# Patient Record
Sex: Female | Born: 1995 | Race: White | Hispanic: No | Marital: Single | Smoking: Former smoker
Health system: Southern US, Community
[De-identification: ages and names within clinical notes are randomized; demographics above are authoritative.]

## PROBLEM LIST (undated history)

## (undated) DIAGNOSIS — E039 Hypothyroidism, unspecified: Secondary | ICD-10-CM

## (undated) DIAGNOSIS — F419 Anxiety disorder, unspecified: Secondary | ICD-10-CM

## (undated) DIAGNOSIS — K219 Gastro-esophageal reflux disease without esophagitis: Secondary | ICD-10-CM

## (undated) DIAGNOSIS — J329 Chronic sinusitis, unspecified: Secondary | ICD-10-CM

## (undated) HISTORY — PX: WISDOM TOOTH EXTRACTION: SHX21

## (undated) HISTORY — PX: NO PAST SURGERIES: SHX2092

---
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
## 2016-05-06 DIAGNOSIS — J329 Chronic sinusitis, unspecified: Secondary | ICD-10-CM

## 2016-05-06 HISTORY — DX: Chronic sinusitis, unspecified: J32.9

## 2016-05-25 DIAGNOSIS — M67432 Ganglion, left wrist: Secondary | ICD-10-CM | POA: Insufficient documentation

## 2016-05-26 ENCOUNTER — Encounter
Admission: RE | Admit: 2016-05-26 | Discharge: 2016-05-26 | Disposition: A | Discharge status: Home / Self Care | Payer: Self-pay | Source: Ambulatory Visit | Attending: Unknown Physician Specialty | Admitting: Unknown Physician Specialty

## 2016-05-26 ENCOUNTER — Encounter: Payer: Self-pay | Admitting: *Deleted

## 2016-05-26 HISTORY — DX: Anxiety disorder, unspecified: F41.9

## 2016-05-26 HISTORY — DX: Hypothyroidism, unspecified: E03.9

## 2016-05-26 HISTORY — DX: Gastro-esophageal reflux disease without esophagitis: K21.9

## 2016-05-26 HISTORY — DX: Chronic sinusitis, unspecified: J32.9

## 2016-05-26 NOTE — Patient Instructions (Signed)
  Your procedure is scheduled on: 06-02-16 Report to Same Day Surgery 2nd floor medical mall [facility](Medical Mall Entrance-take elevator on left to 2nd floor.  Check in with surgery information desk.) To find out your arrival time please call [phone] between 1PM - 3PM on 06-01-16  Remember: Instructions that are not followed completely may result in serious medical risk, up to and including death, or upon the discretion of your surgeon and anesthesiologist your surgery may need to be rescheduled.    _x___ 1. Do not eat food or drink liquids after midnight. No gum chewing or hard candies.     __x__ 2. No Alcohol for 24 hours before or after surgery.   __x__3. No Smoking for 24 prior to surgery.   ____  4. Bring all medications with you on the day of surgery if instructed.    __x__ 5. Notify your doctor if there is any change in your medical condition     (cold, fever, infections).     Do not wear jewelry, make-up, hairpins, clips or nail polish.  Do not wear lotions, powders, or perfumes. You may wear deodorant.  Do not shave 48 hours prior to surgery. Men may shave face and neck.  Do not bring valuables to the hospital.    [facility] not [facility] for any belongings or valuables.               Contacts, dentures or bridgework may not be worn into surgery.  Leave your suitcase in the car. After surgery it may be brought to your room.  For patients admitted to the hospital, discharge time is determined by your treatment team.   Patients discharged the day of surgery will not be allowed to drive home.  You will need someone to drive you home and stay with you the night of your procedure.    Please read over the following fact sheets that you were given:   [facility] Pre[facility] and or MRSA Information   _x___ Take these medicines the morning of surgery with A SIP OF WATER:    1. LEVOTHYROXINE  2.  3.  4.  5.  6.  ____Fleets enema or Magnesium Citrate as  directed.   ____ Use CHG Soap or sage wipes as directed on instruction sheet   ____ Use inhalers on the day of surgery and bring to hospital day of surgery  ____ Stop metformin 2 days prior to surgery    ____ Take 1/2 of usual insulin dose the night before surgery and none on the morning of surgery.   ____ Stop Aspirin, Coumadin, Pllavix ,Eliquis, Effient, or Pradaxa  x__ Stop Anti-inflammatories such as Advil, Aleve, Ibuprofen, Motrin, Naproxen,          Naprosyn, Goodies powders or aspirin products NOW-Ok to take Tylenol.   ____ Stop supplements until after surgery.    ____ Bring C-Pap to the hospital.

## 2016-06-02 ENCOUNTER — Ambulatory Visit: Payer: BLUE CROSS/BLUE SHIELD | Admitting: Certified Registered"

## 2016-06-02 ENCOUNTER — Encounter
Admission: RE | Disposition: A | Discharge status: Home / Self Care | Payer: Self-pay | Source: Ambulatory Visit | Attending: Unknown Physician Specialty

## 2016-06-02 ENCOUNTER — Encounter: Payer: Self-pay | Admitting: *Deleted

## 2016-06-02 ENCOUNTER — Ambulatory Visit
Admission: RE | Admit: 2016-06-02 | Discharge: 2016-06-02 | Disposition: A | Discharge status: Home / Self Care | Payer: BLUE CROSS/BLUE SHIELD | Source: Ambulatory Visit | Attending: Unknown Physician Specialty | Admitting: Unknown Physician Specialty

## 2016-06-02 DIAGNOSIS — E669 Obesity, unspecified: Secondary | ICD-10-CM | POA: Insufficient documentation

## 2016-06-02 DIAGNOSIS — Z8489 Family history of other specified conditions: Secondary | ICD-10-CM | POA: Insufficient documentation

## 2016-06-02 DIAGNOSIS — E039 Hypothyroidism, unspecified: Secondary | ICD-10-CM | POA: Insufficient documentation

## 2016-06-02 DIAGNOSIS — F329 Major depressive disorder, single episode, unspecified: Secondary | ICD-10-CM | POA: Insufficient documentation

## 2016-06-02 DIAGNOSIS — Z811 Family history of alcohol abuse and dependence: Secondary | ICD-10-CM | POA: Diagnosis not present

## 2016-06-02 DIAGNOSIS — M67432 Ganglion, left wrist: Secondary | ICD-10-CM | POA: Diagnosis not present

## 2016-06-02 DIAGNOSIS — Z79899 Other long term (current) drug therapy: Secondary | ICD-10-CM | POA: Diagnosis not present

## 2016-06-02 HISTORY — PX: GANGLION CYST EXCISION: SHX1691

## 2016-06-02 LAB — POCT PREGNANCY, URINE: Preg Test, Ur: NEGATIVE

## 2016-06-02 SURGERY — EXCISION, GANGLION CYST, WRIST
Anesthesia: General | Site: Wrist | Laterality: Left | Wound class: Clean

## 2016-06-02 MED ORDER — ONDANSETRON HCL 4 MG/2ML IJ SOLN
INTRAMUSCULAR | Status: AC
  Filled 2016-06-02: qty 2

## 2016-06-02 MED ORDER — HYDROCODONE-ACETAMINOPHEN 5-325 MG PO TABS
1.0000 | ORAL_TABLET | Freq: Four times a day (QID) | ORAL | Status: DC | PRN
  Administered 2016-06-02: 1 via ORAL

## 2016-06-02 MED ORDER — DEXAMETHASONE SODIUM PHOSPHATE 10 MG/ML IJ SOLN
INTRAMUSCULAR | Status: AC
  Filled 2016-06-02: qty 1

## 2016-06-02 MED ORDER — FENTANYL CITRATE (PF) 100 MCG/2ML IJ SOLN
INTRAMUSCULAR | Status: AC
  Filled 2016-06-02: qty 2

## 2016-06-02 MED ORDER — DEXAMETHASONE SODIUM PHOSPHATE 10 MG/ML IJ SOLN
INTRAMUSCULAR | Status: DC | PRN
  Administered 2016-06-02: 4 mg via INTRAVENOUS

## 2016-06-02 MED ORDER — FENTANYL CITRATE (PF) 100 MCG/2ML IJ SOLN
INTRAMUSCULAR | Status: DC | PRN
  Administered 2016-06-02: 50 ug via INTRAVENOUS
  Administered 2016-06-02 (×4): 25 ug via INTRAVENOUS

## 2016-06-02 MED ORDER — ONDANSETRON HCL 4 MG/2ML IJ SOLN: 4.0000 mg | Freq: Once | INTRAMUSCULAR | Status: DC | PRN

## 2016-06-02 MED ORDER — HYDROCODONE-ACETAMINOPHEN 5-325 MG PO TABS
ORAL_TABLET | ORAL | Status: AC
  Filled 2016-06-02: qty 1

## 2016-06-02 MED ORDER — FAMOTIDINE 20 MG PO TABS
20.0000 mg | ORAL_TABLET | Freq: Once | ORAL | Status: AC
  Administered 2016-06-02: 20 mg via ORAL

## 2016-06-02 MED ORDER — BUPIVACAINE HCL (PF) 0.5 % IJ SOLN
INTRAMUSCULAR | Status: AC
  Filled 2016-06-02: qty 30

## 2016-06-02 MED ORDER — FENTANYL CITRATE (PF) 100 MCG/2ML IJ SOLN
25.0000 ug | INTRAMUSCULAR | Status: DC | PRN
  Administered 2016-06-02 (×3): 25 ug via INTRAVENOUS

## 2016-06-02 MED ORDER — BUPIVACAINE HCL (PF) 0.5 % IJ SOLN
INTRAMUSCULAR | Status: DC | PRN
  Administered 2016-06-02: 10 mL

## 2016-06-02 MED ORDER — MIDAZOLAM HCL 2 MG/2ML IJ SOLN
INTRAMUSCULAR | Status: AC
  Filled 2016-06-02: qty 2

## 2016-06-02 MED ORDER — ONDANSETRON HCL 4 MG/2ML IJ SOLN
INTRAMUSCULAR | Status: DC | PRN
  Administered 2016-06-02: 4 mg via INTRAVENOUS

## 2016-06-02 MED ORDER — MIDAZOLAM HCL 2 MG/2ML IJ SOLN
INTRAMUSCULAR | Status: DC | PRN
  Administered 2016-06-02: 2 mg via INTRAVENOUS

## 2016-06-02 MED ORDER — NORCO 5-325 MG PO TABS: 1.0000 | ORAL_TABLET | Freq: Four times a day (QID) | ORAL | 0 refills | Status: DC | PRN

## 2016-06-02 MED ORDER — LIDOCAINE HCL (PF) 2 % IJ SOLN
INTRAMUSCULAR | Status: AC
  Filled 2016-06-02: qty 2

## 2016-06-02 MED ORDER — FAMOTIDINE 20 MG PO TABS
ORAL_TABLET | ORAL | Status: AC
  Filled 2016-06-02: qty 1

## 2016-06-02 MED ORDER — PROPOFOL 10 MG/ML IV BOLUS
INTRAVENOUS | Status: DC | PRN
  Administered 2016-06-02: 200 mg via INTRAVENOUS

## 2016-06-02 MED ORDER — FENTANYL CITRATE (PF) 100 MCG/2ML IJ SOLN
INTRAMUSCULAR | Status: AC
  Administered 2016-06-02: 25 ug via INTRAVENOUS
  Filled 2016-06-02: qty 2

## 2016-06-02 MED ORDER — LACTATED RINGERS IV SOLN
INTRAVENOUS | Status: DC
  Administered 2016-06-02: 09:00:00 via INTRAVENOUS

## 2016-06-02 MED ORDER — PROPOFOL 10 MG/ML IV BOLUS
INTRAVENOUS | Status: AC
  Filled 2016-06-02: qty 20

## 2016-06-02 MED ORDER — LIDOCAINE HCL (CARDIAC) 20 MG/ML IV SOLN
INTRAVENOUS | Status: DC | PRN
  Administered 2016-06-02: 100 mg via INTRAVENOUS

## 2016-06-02 SURGICAL SUPPLY — 32 items
BANDAGE ELASTIC 3 LF NS (GAUZE/BANDAGES/DRESSINGS) ×3 IMPLANT
BLADE CLIPPER SURG (BLADE) ×3 IMPLANT
BNDG ESMARK 4X12 TAN STRL LF (GAUZE/BANDAGES/DRESSINGS) ×3 IMPLANT
CANISTER SUCT 1200ML W/VALVE (MISCELLANEOUS) ×3 IMPLANT
CAST PADDING 3X4FT ST 30246 (SOFTGOODS) ×2
CLOSURE WOUND 1/4X4 (GAUZE/BANDAGES/DRESSINGS) ×1
CUFF TOURN 18 STER (MISCELLANEOUS) ×3 IMPLANT
DURAPREP 26ML APPLICATOR (WOUND CARE) ×3 IMPLANT
ELECT REM PT RETURN 9FT ADLT (ELECTROSURGICAL) ×3
ELECTRODE REM PT RTRN 9FT ADLT (ELECTROSURGICAL) ×1 IMPLANT
GAUZE SPONGE 4X4 12PLY STRL (GAUZE/BANDAGES/DRESSINGS) ×3 IMPLANT
GLOVE BIO SURGEON STRL SZ8 (GLOVE) ×3 IMPLANT
GLOVE BIOGEL M STRL SZ7.5 (GLOVE) ×12 IMPLANT
GLOVE INDICATOR 8.0 STRL GRN (GLOVE) ×3 IMPLANT
GOWN STRL REUS W/ TWL LRG LVL3 (GOWN DISPOSABLE) ×1 IMPLANT
GOWN STRL REUS W/TWL LRG LVL3 (GOWN DISPOSABLE) ×2
GOWN STRL REUS W/TWL LRG LVL4 (GOWN DISPOSABLE) ×3 IMPLANT
KIT RM TURNOVER STRD PROC AR (KITS) ×3 IMPLANT
NS IRRIG 500ML POUR BTL (IV SOLUTION) ×3 IMPLANT
PACK EXTREMITY ARMC (MISCELLANEOUS) ×3 IMPLANT
PAD CAST CTTN 3X4 STRL (SOFTGOODS) ×1 IMPLANT
SPLINT CAST 1 STEP 3X12 (MISCELLANEOUS) ×3 IMPLANT
STOCKINETTE STRL 4IN 9604848 (GAUZE/BANDAGES/DRESSINGS) ×3 IMPLANT
STRIP CLOSURE SKIN 1/4X4 (GAUZE/BANDAGES/DRESSINGS) ×2 IMPLANT
SUT ETHILON 3-0 FS-10 30 BLK (SUTURE) ×3
SUT ETHILON 4-0 (SUTURE) ×2
SUT ETHILON 4-0 FS2 18XMFL BLK (SUTURE) ×1
SUT VICRYL 3-0 27IN (SUTURE) ×3 IMPLANT
SUTURE EHLN 3-0 FS-10 30 BLK (SUTURE) ×1 IMPLANT
SUTURE ETHILON 3-0 IMPLANT
SUTURE ETHLN 4-0 FS2 18XMF BLK (SUTURE) ×1 IMPLANT
SWABSTK COMLB BENZOIN TINCTURE (MISCELLANEOUS) ×3 IMPLANT

## 2016-06-02 NOTE — Transfer of Care (Signed)
Immediate Anesthesia Transfer of Care Note  Patient: Frances May  Procedure(s) Performed: Procedure(s): REMOVAL GANGLION OF WRIST (Left)  Patient Location: PACU  Anesthesia Type:General  Level of Consciousness: awake and responds to stimulation  Airway & Oxygen Therapy: Patient Spontanous Breathing and Patient connected to face mask oxygen  Post-op Assessment: Report given to RN and Post -op Vital signs reviewed and stable  Post vital signs: Reviewed and stable  Last Vitals:  Vitals:   06/02/16 0843 06/02/16 1122  BP: (!) 144/88 121/80  Pulse: 97 (!) 101  Resp: 18 11  Temp: 36.9 C 36.3 C    Last Pain:  Vitals:   06/02/16 0843  TempSrc: Oral         Complications: No apparent anesthesia complications

## 2016-06-02 NOTE — Anesthesia Preprocedure Evaluation (Signed)
Anesthesia Evaluation  Patient identified by MRN, date of birth, ID band Patient awake    Reviewed: Allergy & Precautions, NPO status , Patient's Chart, lab work & pertinent test results  Airway Mallampati: III  TM Distance: <3 FB     Dental  (+) Chipped   Pulmonary  Sinus issues   Pulmonary exam normal        Cardiovascular negative cardio ROS Normal cardiovascular exam     Neuro/Psych Anxiety negative neurological ROS     GI/Hepatic Neg liver ROS, GERD  ,  Endo/Other  Hypothyroidism   Renal/GU negative Renal ROS  negative genitourinary   Musculoskeletal negative musculoskeletal ROS (+)   Abdominal (+) + obese,   Peds negative pediatric ROS (+)  Hematology negative hematology ROS (+)   Anesthesia Other Findings   Reproductive/Obstetrics                             Anesthesia Physical Anesthesia Plan  ASA: II  Anesthesia Plan: General   Post-op Pain Management:    Induction: Intravenous  Airway Management Planned: LMA and Oral ETT  Additional Equipment:   Intra-op Plan:   Post-operative Plan: Extubation in OR  Informed Consent: I have reviewed the patients History and Physical, chart, labs and discussed the procedure including the risks, benefits and alternatives for the proposed anesthesia with the patient or authorized representative who has indicated his/her understanding and acceptance.   Dental advisory given  Plan Discussed with: CRNA and Surgeon  Anesthesia Plan Comments: (Patient denies reflux issues)        Anesthesia Quick Evaluation

## 2016-06-02 NOTE — Anesthesia Procedure Notes (Signed)
Procedure Name: LMA Insertion Performed by: Khalessi Blough Pre-anesthesia Checklist: Patient identified, Patient being monitored, Timeout performed, Emergency Drugs available and Suction available Patient Re-evaluated:Patient Re-evaluated prior to inductionOxygen Delivery Method: Circle system utilized Preoxygenation: Pre-oxygenation with 100% oxygen Intubation Type: IV induction LMA: LMA inserted LMA Size: 4.0 Tube type: Oral Number of attempts: 1 Placement Confirmation: positive ETCO2 and breath sounds checked- equal and bilateral Tube secured with: Tape Dental Injury: Teeth and Oropharynx as per pre-operative assessment        

## 2016-06-02 NOTE — Discharge Instructions (Signed)
Ice pack °Elevation °RTC in about 10 days ° ° °AMBULATORY SURGERY  °DISCHARGE INSTRUCTIONS ° ° °1) The drugs that you were given will stay in your system until tomorrow so for the next 24 hours you should not: ° °A) Drive an automobile °B) Make any legal decisions °C) Drink any alcoholic beverage ° ° °2) You may resume regular meals tomorrow.  Today it is better to start with liquids and gradually work up to solid foods. ° °You may eat anything you prefer, but it is better to start with liquids, then soup and crackers, and gradually work up to solid foods. ° ° °3) Please notify your doctor immediately if you have any unusual bleeding, trouble breathing, redness and pain at the surgery site, drainage, fever, or pain not relieved by medication. ° ° ° °4) Additional Instructions: ° ° ° ° ° ° ° °Please contact your physician with any problems or Same Day Surgery at [phone], Monday through Friday 6 am to 4 pm, or [place] [facility] [place] Main number at [phone]. °

## 2016-06-02 NOTE — Anesthesia Postprocedure Evaluation (Signed)
Anesthesia Post Note  Patient: Frances May  Procedure(s) Performed: Procedure(s) (LRB): REMOVAL GANGLION OF WRIST (Left)  Patient location during evaluation: PACU Anesthesia Type: General Level of consciousness: awake and alert and oriented Pain management: pain level controlled Vital Signs Assessment: post-procedure vital signs reviewed and stable Respiratory status: spontaneous breathing Cardiovascular status: blood pressure returned to baseline Anesthetic complications: no     Last Vitals:  Vitals:   06/02/16 1223 06/02/16 1256  BP: 109/69 106/62  Pulse: 74   Resp: 16 16  Temp: 36.2 C     Last Pain:  Vitals:   06/02/16 1223  TempSrc:   PainSc: 5                  Shirin Echeverry

## 2016-06-02 NOTE — Op Note (Signed)
      PATIENT:Frances May, Frances May  PRE-OPERATIVE DIAGNOSIS: Left  WRIST GANGLION M67.431  POST-OPERATIVE DIAGNOSIS: Left  WRIST GANGLION  PROCEDURE: Excision left wrist ganglion  SURGEON: Erin SonsHarold Yuka Lallier, Montez HagemanJr., MD  ASSISTANTS: None  ANESTHESIA: Gen.  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 Because of the discomfort the patient was desirous of having it excised. Consequently the patient was scheduled for excision of ganglion cyst left wrist.  OP NOTE: The patient was taken the operating room where satisfactory  general anesthesia was achieved. A tourniquet was applied to the left upper extremity. The left upper extremity was prepped and draped in usual fashion for a procedure about the wrist. The left upper extremity was exsanguinated and the tourniquet was inflated. An inch and a half transverse incision was made over the mid aspect of the dorsal ganglion. I bluntly dissected down to the ganglion cyst. The extensor tendons were retracted. The base of the cyst was exposed. I then incised the cyst.. Some clear gelatinous material was expressed. The cyst did not seem to enter the wrist joint. It seemed to be positioned over the dorsal base of the second metatarsal. I then completely excised the cyst including a portion of the dorsal capsule. I then osteotomized the dorsal prominence of the base of the second metatarsal.  The tourniquet was released. It was up for about 20 minutes. Bleeding was controlled with coagulation cautery. I closed the subcutaneous tissue with 3-0 Vicryl. The skin was closed with a running 3-0 subcuticular suture.  Steri-Strips were applied along with a dressing followed by a fiberglass volar splint.  The patient then awakened and transferred to a stretcher bed. The patient was then taken to the recovery room in satisfactory condition. Blood loss was negligible.

## 2016-06-02 NOTE — H&P (Signed)
  H and P reviewed. No changes. Uploaded at later date. 

## 2016-06-02 NOTE — Anesthesia Post-op Follow-up Note (Cosign Needed)
Anesthesia QCDR form completed.        

## 2016-06-03 ENCOUNTER — Encounter: Payer: Self-pay | Admitting: Unknown Physician Specialty

## 2016-06-04 LAB — SURGICAL PATHOLOGY

## 2018-04-05 DIAGNOSIS — E039 Hypothyroidism, unspecified: Secondary | ICD-10-CM

## 2018-04-05 HISTORY — DX: Hypothyroidism, unspecified: E03.9

## 2021-08-07 ENCOUNTER — Telehealth: Payer: BLUE CROSS/BLUE SHIELD | Admitting: Physician Assistant

## 2021-08-07 DIAGNOSIS — K0381 Cracked tooth: Secondary | ICD-10-CM

## 2021-08-07 DIAGNOSIS — K047 Periapical abscess without sinus: Secondary | ICD-10-CM

## 2021-08-07 MED ORDER — IBUPROFEN 800 MG PO TABS: 800.0000 mg | ORAL_TABLET | Freq: Three times a day (TID) | ORAL | 0 refills | Status: DC | PRN

## 2021-08-07 MED ORDER — AMOXICILLIN 500 MG PO CAPS: 500.0000 mg | ORAL_CAPSULE | Freq: Three times a day (TID) | ORAL | 0 refills | Status: AC

## 2021-08-07 NOTE — Progress Notes (Signed)
?Virtual Visit Consent  ? ?[name], you are scheduled for a virtual visit with a [facility] provider today. Just as with appointments in the office, your consent must be obtained to participate. Your consent will be active for this visit and any virtual visit you may have with one of our providers in the next 365 days. If you have a MyChart account, a copy of this consent can be sent to you electronically. ? ?As this is a virtual visit, video technology does not allow for your provider to perform a traditional examination. This may limit your provider's ability to fully assess your condition. If your provider identifies any concerns that need to be evaluated in person or the need to arrange testing (such as labs, EKG, etc.), we will make arrangements to do so. Although advances in technology are sophisticated, we cannot ensure that it will always work on either your end or our end. If the connection with a video visit is poor, the visit may have to be switched to a telephone visit. With either a video or telephone visit, we are not always able to ensure that we have a secure connection. ? ?By engaging in this virtual visit, you consent to the provision of healthcare and authorize for your insurance to be billed (if applicable) for the services provided during this visit. Depending on your insurance coverage, you may receive a charge related to this service. ? ?I need to obtain your verbal consent now. Are you willing to proceed with your visit today? Frances May has provided verbal consent on 08/07/2021 for a virtual visit (video or telephone). Frances Loveless, PA-C ? ?Date: 08/07/2021 3:04 PM ? ?Virtual Visit via Video Note  ? ?Frances May, connected with  Frances May  ([id number], 01-Jun-1995) on 08/07/21 at  3:00 PM EDT by a video-enabled telemedicine application and verified that I am speaking with the correct person using two identifiers. ? ?Location: ?Patient: Virtual Visit Location  Patient: Home ?Provider: Virtual Visit Location Provider: Home Office ?  ?I discussed the limitations of evaluation and management by telemedicine and the availability of in person appointments. The patient expressed understanding and agreed to proceed.   ? ?History of Present Illness: ?Frances May is a 26 y.o. who identifies as a female who was assigned female at birth, and is being seen today for left jaw pain and broken tooth. ? ?HPI: Dental Pain  ?This is a new problem. The current episode started yesterday. The problem occurs constantly. The problem has been gradually worsening. The pain is moderate. Associated symptoms include facial pain and thermal sensitivity. Pertinent negatives include no difficulty swallowing, fever, oral bleeding or sinus pressure. She has tried acetaminophen and heat for the symptoms. The treatment provided no relief.   ? ? ?Problems: There are no problems to display for this patient. ?  ?Allergies:  ?Allergies  ?Allergen Reactions  ? Other Other (See Comments)  ?  Cats - make pt's nose itch  ? ?Medications:  ?Current Outpatient Medications:  ?  amoxicillin (AMOXIL) 500 MG capsule, Take 1 capsule (500 mg total) by mouth 3 (three) times daily for 10 days., Disp: 30 capsule, Rfl: 0 ?  ibuprofen (ADVIL) 800 MG tablet, Take 1 tablet (800 mg total) by mouth every 8 (eight) hours as needed., Disp: 30 tablet, Rfl: 0 ?  DiphenhydrAMINE HCl 50 MG/30ML LIQD, Take 30 mLs by mouth at bedtime., Disp: , Rfl:  ?  levothyroxine (SYNTHROID, LEVOTHROID) 50 MCG tablet, Take 50 mcg  by mouth daily before breakfast., Disp: , Rfl:  ?  NORCO 5-325 MG tablet, Take 1-2 tablets by mouth every 6 (six) hours as needed for moderate pain. MAXIMUM TOTAL ACETAMINOPHEN DOSE IS 4000 MG PER DAY, Disp: 20 tablet, Rfl: 0 ? ?Observations/Objective: ?Patient is well-developed, well-nourished in no acute distress.  ?Resting comfortably at home.  ?Head is normocephalic, atraumatic.  ?No labored breathing.  ?Speech is clear  and coherent with logical content.  ?Patient is alert and oriented at baseline.  ? ? ?Assessment and Plan: ?1. Cracked tooth ?- amoxicillin (AMOXIL) 500 MG capsule; Take 1 capsule (500 mg total) by mouth 3 (three) times daily for 10 days.  Dispense: 30 capsule; Refill: 0 ?- ibuprofen (ADVIL) 800 MG tablet; Take 1 tablet (800 mg total) by mouth every 8 (eight) hours as needed.  Dispense: 30 tablet; Refill: 0 ? ?2. Dental infection ?- amoxicillin (AMOXIL) 500 MG capsule; Take 1 capsule (500 mg total) by mouth 3 (three) times daily for 10 days.  Dispense: 30 capsule; Refill: 0 ?- ibuprofen (ADVIL) 800 MG tablet; Take 1 tablet (800 mg total) by mouth every 8 (eight) hours as needed.  Dispense: 30 tablet; Refill: 0 ? ?- Suspect possible infection and broken tooth ?- Amoxicillin prescribed ?- Ibuprofen prescribed for pain, may alternate every 3-4 hours with tylenol ?- Ice to outer jaw for pain and swelling ?- OTC dental putty to cover broken tooth ?- Keep scheduled follow up with dentist next week ? ?Follow Up Instructions: ?I discussed the assessment and treatment plan with the patient. The patient was provided an opportunity to ask questions and all were answered. The patient agreed with the plan and demonstrated an understanding of the instructions.  A copy of instructions were sent to the patient via MyChart unless otherwise noted below.  ? ? ?The patient was advised to call back or seek an in-person evaluation if the symptoms worsen or if the condition fails to improve as anticipated. ? ?Time:  ?I spent 10 minutes with the patient via telehealth technology discussing the above problems/concerns.   ? ?Frances Loveless, PA-C ? ?

## 2021-08-07 NOTE — Patient Instructions (Signed)
?[employer], thank you for joining Mar Daring, PA-C for today's virtual visit.  While this provider is not your primary care provider (PCP), if your PCP is located in our provider database this encounter information will be shared with them immediately following your visit. ? ?Consent: ?(Patient) Frances May provided verbal consent for this virtual visit at the beginning of the encounter. ? ?Current Medications: ? ?Current Outpatient Medications:  ?  amoxicillin (AMOXIL) 500 MG capsule, Take 1 capsule (500 mg total) by mouth 3 (three) times daily for 10 days., Disp: 30 capsule, Rfl: 0 ?  ibuprofen (ADVIL) 800 MG tablet, Take 1 tablet (800 mg total) by mouth every 8 (eight) hours as needed., Disp: 30 tablet, Rfl: 0 ?  DiphenhydrAMINE HCl 50 MG/30ML LIQD, Take 30 mLs by mouth at bedtime., Disp: , Rfl:  ?  levothyroxine (SYNTHROID, LEVOTHROID) 50 MCG tablet, Take 50 mcg by mouth daily before breakfast., Disp: , Rfl:  ?  NORCO 5-325 MG tablet, Take 1-2 tablets by mouth every 6 (six) hours as needed for moderate pain. MAXIMUM TOTAL ACETAMINOPHEN DOSE IS 4000 MG PER DAY, Disp: 20 tablet, Rfl: 0  ? ?Medications ordered in this encounter:  ?Meds ordered this encounter  ?Medications  ? amoxicillin (AMOXIL) 500 MG capsule  ?  Sig: Take 1 capsule (500 mg total) by mouth 3 (three) times daily for 10 days.  ?  Dispense:  30 capsule  ?  Refill:  0  ?  Order Specific Question:   Supervising Provider  ?  Answer:   Noemi Chapel [[id number]]  ? ibuprofen (ADVIL) 800 MG tablet  ?  Sig: Take 1 tablet (800 mg total) by mouth every 8 (eight) hours as needed.  ?  Dispense:  30 tablet  ?  Refill:  0  ?  Order Specific Question:   Supervising Provider  ?  Answer:   Noemi Chapel [[id number]]  ?  ? ?*If you need refills on other medications prior to your next appointment, please contact your pharmacy* ? ?Follow-Up: ?Call back or seek an in-person evaluation if the symptoms worsen or if the condition fails to improve as  anticipated. ? ?Other Instructions ?OTC Dental putty: Dentemp repair kit, Dentek advanced repair kit ? ?Tooth Injuries ?Tooth injuries include cracked or broken teeth, teeth that have been dislodged or moved out of place, and teeth that have been knocked out of the mouth. ?Severe tooth injuries need to be treated quickly to save the tooth. However, sometimes it is not possible to save a tooth after an injury, and the tooth may need to be removed. ?What are the causes? ?Tooth injuries may be caused by any force that is strong enough to chip, break, dislodge, or knock out a tooth. The injuries may come from: ?Sports accidents. ?Falls. ?Fights. ?What increases the risk? ?The following factors may make you more likely to lose a tooth: ?Playing contact sports, such as football or boxing, without using a mouth guard. ?Any medical condition that increases the risk of falling or fainting. ?Anything that causes injury to the face. ?Any condition that reduces the support of the root of the tooth. ?What are the signs or symptoms? ?Symptoms of this condition include a tooth that: ?May have moved out of position. ?May have moved into or out of the tooth socket. ?May not be visible in the gums, if the fracture was severe. ?Other symptoms of a tooth injury include: ?Pain, especially when chewing. ?A loose tooth. ?Bleeding in or around the tooth. ?  Swelling or bruising near the tooth. ?Swelling or bruising of the lip over the injured tooth. ?Increased tooth sensitivity to heat and cold. ?A tooth that is knocked out of its place in the gum. ?How is this diagnosed? ?A tooth injury can be diagnosed with a complete history and a physical exam. You may also need dental X-rays to check for injuries to the root of the tooth. ?How is this treated? ?Treatment depends on the type of injury and its severity. Treatment may need to be done quickly to save your tooth. Possible treatments include: ?Replacing a tooth fragment with a filling, a cap,  or a hard, protective cover (crown). This may be an option for a chip or fracture that does not affect the inside of your tooth. ?Repairing the inside of the tooth (root canal), if the dentist thinks it is necessary. ?The root canal usually needs to be done within a few days of the injury. This may be done to treat a tooth fracture that affects the pulp. ?Repositioning a dislodged tooth. ?Using a brace or splint to hold the tooth in place. ?Replacing a knocked-out tooth in the socket, if possible, and then doing a root canal. ?Extracting a tooth. This is done for a fracture that extends below the gums or a fracture that splits the tooth completely. ?Taking medicine, including: ?Pain medicine. ?Antibiotic medicine to help prevent infection. ?Follow these instructions at home: ?Medicines ?Take over-the-counter and prescription medicines only as told by your dentist. ?Take your antibiotic medicine as told by your dentist. Do not stop taking the antibiotic even if you start to feel better. ?Do not drive or use heavy machinery while taking prescription pain medicine. ?Caring for your teeth ? ?Do not eat or chew on very hard objects. These include ice cubes, pens, pencils, hard candy, and popcorn kernels. ?Do not clench or grind your teeth. Tell your dentist if you grind your teeth while you sleep. ?Brush your teeth gently as directed by your dentist. ?Do not use your teeth to open packages. ?Always wear mouth protection when you play contact sports. ?Managing pain and swelling ?Gargle with a mixture of salt and water 3-4 times a day or as needed. To make salt water, completely dissolve ?-1 tsp (3-6 g) of salt in 1 cup (237 mL) of warm water. ?If directed, apply ice to your mouth near the injured tooth: ?Put ice in a plastic bag. ?Place a towel between your skin and the bag. ?Leave the ice on for 20 minutes, 2-3 times a day. ?Remove the ice if your skin turns bright red. This is very important. If you cannot feel pain,  heat, or cold, you have a greater risk of damage to the area. ?General instructions ?Do not use any products that contain nicotine or tobacco. These products include cigarettes, chewing tobacco, and vaping devices, such as e-cigarettes. If you need help quitting, ask your health care provider. ?Your health care provider may recommend that you eat certain foods. This may include eating only soft foods. ?Check the injured area every day for signs of infection. Watch for: ?Redness, swelling, or pain. ?Fluid, blood, or pus. ?Keep all follow-up visits. This is important. ?Contact a dental care provider if: ?Your pain gets much worse, even after you take pain medicine. ?You have pus coming from the site of the tooth injury. ?You develop swelling near your injured tooth. ?You have a tooth splint, and it becomes loose. ?Your tooth becomes loose. ?Get help right away if: ?You have  swelling in the face. ?You have a fever. ?You have bleeding near the tooth that does not stop in 10 minutes. ?You have trouble swallowing. ?You have trouble opening your mouth. ?Your permanent tooth comes out after it is repositioned. ?Summary ?Tooth injuries include cracked or broken teeth and teeth that have been dislodged or knocked out of the mouth. ?A tooth injury can be diagnosed with a medical history and a physical exam. You may also need dental X-rays to check for injuries to the root of the tooth. ?Treatment depends on the type of injury you have and its severity. Treatment may need to be done quickly to save your tooth. ?This information is not intended to replace advice given to you by your health care provider. Make sure you discuss any questions you have with your health care provider. ?Document Revised: 11/28/2019 Document Reviewed: 11/28/2019 ?Elsevier Patient Education ? [place]. ? ? ? ?If you have been instructed to have an in-person evaluation today at a local Urgent Care facility, please use the link below. It will  take you to a list of all of our available [facility], including address, phone number and hours of operation. Please do not delay care.  ?[facility] ? ?If you or a family member do not have a

## 2021-08-18 DIAGNOSIS — E038 Other specified hypothyroidism: Secondary | ICD-10-CM | POA: Insufficient documentation

## 2021-08-18 DIAGNOSIS — E039 Hypothyroidism, unspecified: Secondary | ICD-10-CM | POA: Insufficient documentation

## 2021-08-18 DIAGNOSIS — R600 Localized edema: Secondary | ICD-10-CM | POA: Insufficient documentation

## 2022-05-23 ENCOUNTER — Telehealth: Payer: 59 | Admitting: Family

## 2022-05-23 DIAGNOSIS — K0889 Other specified disorders of teeth and supporting structures: Secondary | ICD-10-CM

## 2022-05-23 MED ORDER — AMOXICILLIN-POT CLAVULANATE 875-125 MG PO TABS: 1.0000 | ORAL_TABLET | Freq: Two times a day (BID) | ORAL | 0 refills | Status: DC

## 2022-05-23 NOTE — Progress Notes (Signed)
Virtual Visit Consent   [employer], you are scheduled for a virtual visit with a [facility] provider today. Just as with appointments in the office, your consent must be obtained to participate. Your consent will be active for this visit and any virtual visit you may have with one of our providers in the next 365 days. If you have a MyChart account, a copy of this consent can be sent to you electronically.  As this is a virtual visit, video technology does not allow for your provider to perform a traditional examination. This may limit your provider's ability to fully assess your condition. If your provider identifies any concerns that need to be evaluated in person or the need to arrange testing (such as labs, EKG, etc.), we will make arrangements to do so. Although advances in technology are sophisticated, we cannot ensure that it will always work on either your end or our end. If the connection with a video visit is poor, the visit may have to be switched to a telephone visit. With either a video or telephone visit, we are not always able to ensure that we have a secure connection.  By engaging in this virtual visit, you consent to the provision of healthcare and authorize for your insurance to be billed (if applicable) for the services provided during this visit. Depending on your insurance coverage, you may receive a charge related to this service.  I need to obtain your verbal consent now. Are you willing to proceed with your visit today? Ayssa Coba has provided verbal consent on 05/23/2022 for a virtual visit (video or telephone). Evelina Dun, FNP  Date: 05/23/2022 1:14 PM  Virtual Visit via Video Note   I, Evelina Dun, connected with  Cladie Ramberg  ([id number], 1996/03/29) on 05/23/22 at  1:15 PM EST by a video-enabled telemedicine application and verified that I am speaking with the correct person using two identifiers.  Location: Patient: Virtual Visit Location Patient:  Home Provider: Virtual Visit Location Provider: Home Office   I discussed the limitations of evaluation and management by telemedicine and the availability of in person appointments. The patient expressed understanding and agreed to proceed.    History of Present Illness: Frances May is a 27 y.o. who identifies as a female who was assigned female at birth, and is being seen today for dental pain. She reports she is having left upper dental pain after a cracked tooth. She has a dentist appointment, but one month out. Reports aching pain and pressure of 2 out 10. The pain is worse when laying down. Denies any fever. She has taken motrin with mild relief.   HPI: HPI  Problems: There are no problems to display for this patient.   Allergies:  Allergies  Allergen Reactions   Other Other (See Comments)    Cats - make pt's nose itch   Medications:  Current Outpatient Medications:    amoxicillin-clavulanate (AUGMENTIN) 875-125 MG tablet, Take 1 tablet by mouth 2 (two) times daily., Disp: 14 tablet, Rfl: 0   levothyroxine (SYNTHROID, LEVOTHROID) 50 MCG tablet, Take 50 mcg by mouth daily before breakfast., Disp: , Rfl:   Observations/Objective: Patient is well-developed, well-nourished in no acute distress.  Resting comfortably  at home.  Head is normocephalic, atraumatic.  No labored breathing.  Speech is clear and coherent with logical content.  Patient is alert and oriented at baseline.    Assessment and Plan: 1. Pain, dental - amoxicillin-clavulanate (AUGMENTIN) 875-125 MG tablet; Take 1 tablet by  mouth 2 (two) times daily.  Dispense: 14 tablet; Refill: 0  Keep dentist appointment Rinse mouth out after eating  Start Augmentin BID  Follow up if symptoms worsen or do not improve   Follow Up Instructions: I discussed the assessment and treatment plan with the patient. The patient was provided an opportunity to ask questions and all were answered. The patient agreed with the plan and  demonstrated an understanding of the instructions.  A copy of instructions were sent to the patient via MyChart unless otherwise noted below.     The patient was advised to call back or seek an in-person evaluation if the symptoms worsen or if the condition fails to improve as anticipated.  Time:  I spent 9 minutes with the patient via telehealth technology discussing the above problems/concerns.    Evelina Dun, FNP

## 2022-06-01 ENCOUNTER — Ambulatory Visit (INDEPENDENT_AMBULATORY_CARE_PROVIDER_SITE_OTHER): Payer: 59

## 2022-06-01 ENCOUNTER — Ambulatory Visit (INDEPENDENT_AMBULATORY_CARE_PROVIDER_SITE_OTHER): Payer: 59 | Admitting: Podiatry

## 2022-06-01 ENCOUNTER — Encounter: Payer: Self-pay | Admitting: Podiatry

## 2022-06-01 VITALS — BP 150/87 | HR 85

## 2022-06-01 DIAGNOSIS — M722 Plantar fascial fibromatosis: Secondary | ICD-10-CM

## 2022-06-01 NOTE — Progress Notes (Signed)
   Chief Complaint  Patient presents with   Foot Pain    "I have a little pain in my arches.  I'd like to get orthotics." N - arch pain L - bilateral D - 5 yrs O - gradually gotten worse C - sharp pain, throb, aches A - standing long periods of time T - CBD lotion    Subjective: 27 y.o. female presenting today as a new patient for evaluation of pain and tenderness to the bilateral heels.  Patient states that her mother and grandmother both have a history of plantar fasciitis and she has intermittent heel pain.  She works on her feet all day.  She presents for further treatment evaluation   Past Medical History:  Diagnosis Date   Anxiety    GERD (gastroesophageal reflux disease)    RARE   Hypothyroidism    Sinus infection 05/2016     Objective: Physical Exam General: The patient is alert and oriented x3 in no acute distress.  Dermatology: Skin is warm, dry and supple bilateral lower extremities. Negative for open lesions or macerations bilateral.   Vascular: Dorsalis Pedis and Posterior Tibial pulses palpable bilateral.  Capillary fill time is immediate to all digits.  Neurological: Epicritic and protective threshold intact bilateral.   Musculoskeletal: Tenderness to palpation to the plantar aspect of the bilateral heels along the plantar fascia. All other joints range of motion within normal limits bilateral. Strength 5/5 in all groups bilateral.   Radiographic exam B/L feet 06/01/2022: Normal osseous mineralization. Joint spaces preserved. No fracture/dislocation/boney destruction. No other soft tissue abnormalities or radiopaque foreign bodies.  Small posterior heel spur noted bilateral  Assessment: 1. plantar fasciitis bilateral feet  Plan of Care:  1. Patient evaluated. Xrays reviewed.   2.  Appointment with Pedorthist for custom molded orthotics 3.  Advised against going barefoot.  Recommend daily stretching exercises  4.  Return to clinic as needed  Edrick Kins, DPM [facility]  Dr. Edrick Kins, DPM    [address],  [address]                Office [phone]  Fax [phone]

## 2022-06-16 ENCOUNTER — Ambulatory Visit (INDEPENDENT_AMBULATORY_CARE_PROVIDER_SITE_OTHER): Payer: 59

## 2022-06-16 DIAGNOSIS — M722 Plantar fascial fibromatosis: Secondary | ICD-10-CM

## 2022-06-16 NOTE — Progress Notes (Signed)
Patient presents today to be casted for custom molded orthotics. EVANS is the treating physician.  Impression foam cast was taken. ABN signed.  Patient info-  Shoe size: 6   Shoe style: ATHLETIC  Height: 4FT 11IN  Weight: 240 LBS  Insurance: [place]   Patient will be notified once orthotics arrive in office and reappoint for fitting at that time.

## 2022-06-29 ENCOUNTER — Ambulatory Visit (INDEPENDENT_AMBULATORY_CARE_PROVIDER_SITE_OTHER): Payer: 59 | Admitting: Family Medicine

## 2022-06-29 ENCOUNTER — Encounter: Payer: Self-pay | Admitting: Family Medicine

## 2022-06-29 VITALS — BP 120/80 | HR 96 | Ht 59.0 in | Wt 249.0 lb

## 2022-06-29 DIAGNOSIS — Z124 Encounter for screening for malignant neoplasm of cervix: Secondary | ICD-10-CM

## 2022-06-29 DIAGNOSIS — K219 Gastro-esophageal reflux disease without esophagitis: Secondary | ICD-10-CM

## 2022-06-29 DIAGNOSIS — E038 Other specified hypothyroidism: Secondary | ICD-10-CM

## 2022-06-29 NOTE — Assessment & Plan Note (Signed)
Chronic issue, has been off of her medications, no recent labs.  Updated labs ordered, will await results prior to initiation of pharmacotherapy.

## 2022-06-29 NOTE — Assessment & Plan Note (Signed)
Progressive worsening, utilizing Tums on a semiregular basis.  Lifestyle modification information shared with patient's MyChart.

## 2022-06-29 NOTE — Progress Notes (Signed)
     Primary Care / Sports Medicine Office Visit  Patient Information:  Patient ID: Frances May, female DOB: Jan 01, 1996 Age: 27 y.o. MRN: [id number]   Frances May is a pleasant 27 y.o. female presenting with the following:  Chief Complaint  Patient presents with   Establish Care    Vitals:   06/29/22 0908  BP: 120/80  Pulse: 96  SpO2: 97%   Vitals:   06/29/22 0908  Weight: 249 lb (112.9 kg)  Height: 4\' 11"  (1.499 m)   Body mass index is 50.29 kg/m.  DG Foot Complete Left  Result Date: 06/01/2022 Please see detailed radiograph report in office note.  DG Foot Complete Right  Result Date: 06/01/2022 Please see detailed radiograph report in office note.    Independent interpretation of notes and tests performed by another provider:   None  Procedures performed:   None  Pertinent History, Exam, Impression, and Recommendations:   Frances May was seen today for establish care.  Other specified hypothyroidism Assessment & Plan: Chronic issue, has been off of her medications, no recent labs.  Updated labs ordered, will await results prior to initiation of pharmacotherapy.  Orders: -     TSH -     T4, free -     T3, free  Cervical cancer screening -     Ambulatory referral to Gynecology  Gastroesophageal reflux disease without esophagitis Assessment & Plan: Progressive worsening, utilizing Tums on a semiregular basis.  Lifestyle modification information shared with patient's MyChart.      Orders & Medications No orders of the defined types were placed in this encounter.  Orders Placed This Encounter  Procedures   TSH   T4, free   T3, free   Ambulatory referral to Gynecology     Return in about 6 weeks (around 08/10/2022) for CPE.     Montel Culver, MD, [facility]   Primary Care Sports Medicine Primary Care and Sports Medicine at [facility]

## 2022-06-29 NOTE — Patient Instructions (Addendum)
-   Obtain labs today - Referral coordinator will contact you to schedule visit with gynecology - Review information attached and below - Return for annual physical in 6 weeks - Contact us for any questions/concerns between now and then  Sleep hygiene advice  When possible, maximize regularity in activity and sleep schedule   Regularity in the timing of sleep, food intake, and social activity helps to stabilize the biological clock.Minimizing discrepancies in sleep timing between on-shift and off-shift periods may help you adapt to a fixed-shift schedule and may also help you adapt to each shift type in a rotating-shift schedule (depending onrotation speed and direction).   Create a sleep-friendly bedroom environment   Make sure that your bed is comfortable and that your bedroom is dark, quiet, and cool (around 48F or 18C).Blackout shades may be particularly important to block sunlight during daytime sleep. Creating constantbackground noise in the sleep environment with a fan or humidifier, for example, will eliminate unexpectedsounds that would otherwise wake you up.   Limit exposure to bright light before daytime sleep   Exposure to bright light (eg, sunlight during the morning commute home following a night shift) can bealerting and may also set your biological clock to a time that interferes with daytime sleep.   Make the last hour before bed a "wind-down" time   Engage in relaxing and pleasant activities, dim or block light in the room, and have a light snack.   Do not use alcohol to help you sleep and do not consume alcohol too close to bedtime   Although alcohol may help you to fall asleep more easily, it disrupts your sleep during the night by causingfrequent awakenings. One drink of alcohol should not be consumed within three hours of bedtime.   Smoking and other drugs will disrupt your sleep   If you smoke, do not smoke too close to bedtime or if you wake up during the  intended sleep period. Mostdrugs of abuse can disrupt sleep.   Avoid caffeinated products within six hours of bedtime   In addition to coffee, these may include tea, chocolate, and many sodas.   Exercise regularly, but avoid activities that raise body temperature close to bedtime   Regular exercise can improve sleep quality, but exercising or having a warm bath too close to bedtime candisrupt your ability to fall asleep. Warm baths should be avoided within 1.5 hours of bedtime.   Avoid consuming more than 8 to 10 ounces of liquids close to bedtime   A full or semi-full bladder can contribute to awakenings. Restrict liquids close to bedtime and empty yourbladder just before going to bed.

## 2022-06-30 LAB — TSH: TSH: 7.2 u[IU]/mL — ABNORMAL HIGH (ref 0.450–4.500)

## 2022-06-30 LAB — T3, FREE: T3, Free: 3.6 pg/mL (ref 2.0–4.4)

## 2022-06-30 LAB — T4, FREE: Free T4: 1.23 ng/dL (ref 0.82–1.77)

## 2022-07-12 ENCOUNTER — Other Ambulatory Visit: Payer: Self-pay | Admitting: Podiatry

## 2022-07-12 DIAGNOSIS — M722 Plantar fascial fibromatosis: Secondary | ICD-10-CM

## 2022-07-27 ENCOUNTER — Encounter: Payer: 59 | Admitting: Obstetrics & Gynecology

## 2022-08-02 ENCOUNTER — Encounter: Payer: Self-pay | Admitting: Obstetrics & Gynecology

## 2022-08-10 ENCOUNTER — Other Ambulatory Visit: Payer: Self-pay | Admitting: Family Medicine

## 2022-08-10 ENCOUNTER — Ambulatory Visit (INDEPENDENT_AMBULATORY_CARE_PROVIDER_SITE_OTHER): Payer: 59

## 2022-08-10 ENCOUNTER — Ambulatory Visit (INDEPENDENT_AMBULATORY_CARE_PROVIDER_SITE_OTHER): Payer: 59 | Admitting: Family Medicine

## 2022-08-10 ENCOUNTER — Encounter: Payer: Self-pay | Admitting: Family Medicine

## 2022-08-10 ENCOUNTER — Ambulatory Visit: Payer: 59 | Admitting: Family Medicine

## 2022-08-10 VITALS — BP 126/76 | HR 64 | Ht 59.0 in | Wt 244.0 lb

## 2022-08-10 DIAGNOSIS — Z114 Encounter for screening for human immunodeficiency virus [HIV]: Secondary | ICD-10-CM

## 2022-08-10 DIAGNOSIS — Z0000 Encounter for general adult medical examination without abnormal findings: Secondary | ICD-10-CM

## 2022-08-10 DIAGNOSIS — M722 Plantar fascial fibromatosis: Secondary | ICD-10-CM

## 2022-08-10 DIAGNOSIS — Z1159 Encounter for screening for other viral diseases: Secondary | ICD-10-CM

## 2022-08-10 DIAGNOSIS — E559 Vitamin D deficiency, unspecified: Secondary | ICD-10-CM

## 2022-08-10 DIAGNOSIS — E038 Other specified hypothyroidism: Secondary | ICD-10-CM | POA: Diagnosis not present

## 2022-08-10 DIAGNOSIS — Z13220 Encounter for screening for lipoid disorders: Secondary | ICD-10-CM

## 2022-08-10 HISTORY — DX: Encounter for general adult medical examination without abnormal findings: Z00.00

## 2022-08-10 NOTE — Assessment & Plan Note (Signed)
Most recent labs demonstrated subclinical hypothyroidism, continues to remain off of medications, denies any significant symptomatology, plan for recheck.  Follow labs accordingly.

## 2022-08-10 NOTE — Patient Instructions (Signed)
-   Obtain fasting labs with orders provided (can have water or black coffee but otherwise no food or drink x 8 hours before labs) °- Review information provided °- Attend eye doctor annually, dentist every 6 months, work towards or maintain 30 minutes of moderate intensity physical activity at least 5 days per week, and consume a balanced diet °- Return in 1 year for physical °- Contact us for any questions between now and then °

## 2022-08-10 NOTE — Progress Notes (Signed)
Patient presents today to pick up custom molded foot orthotics recommended by Dr. Logan Bores.   Orthotics were dispensed  Reviewed instructions for break-in and wear. Written instructions given to patient.  Patient will follow up as needed.

## 2022-08-10 NOTE — Assessment & Plan Note (Signed)
Annual examination completed, risk stratification labs ordered, anticipatory guidance provided.  We will follow labs once resulted. 

## 2022-08-10 NOTE — Progress Notes (Signed)
Annual Physical Exam Visit  Patient Information:  Patient ID: Frances May, female DOB: Apr 11, 1995 Age: 27 y.o. MRN: [id number]   Subjective:   CC: Annual Physical Exam  HPI:  Frances May is here for their annual physical.  I reviewed the past medical history, family history, social history, surgical history, and allergies today and changes were made as necessary.  Please see the problem list section below for additional details.  Past Medical History: Past Medical History:  Diagnosis Date   Anxiety    GERD (gastroesophageal reflux disease)    Healthcare maintenance 08/10/2022   Hypothyroidism 2020   Past Surgical History: Past Surgical History:  Procedure Laterality Date   GANGLION CYST EXCISION Left 06/02/2016   Procedure: REMOVAL GANGLION OF WRIST;  Surgeon: Erin Sons, MD;  Location: [facility] ORS;  Service: Orthopedics;  Laterality: Left;   NO PAST SURGERIES     WISDOM TOOTH EXTRACTION     Family History: Family History  Problem Relation Age of Onset   Obesity Mother    Alcohol abuse Father    Allergies: Allergies  Allergen Reactions   Other Other (See Comments)    Cats - make pt's nose itch   Health Maintenance: Health Maintenance  Topic Date Due   PAP SMEAR-Modifier  08/10/2022 (Originally 05/19/2014)   COVID-19 Vaccine (1) 08/26/2022 (Originally 11/16/1995)   PAP-Cervical Cytology Screening  08/10/2023 (Originally 05/19/2014)   Hepatitis C Screening  08/10/2023 (Originally 05/20/2011)   HIV Screening  08/10/2023 (Originally 05/19/2008)   INFLUENZA VACCINE  11/04/2022   DTaP/Tdap/Td (5 - Td or Tdap) 08/18/2023   HPV VACCINES  Aged Out    HM Colonoscopy     This patient has no relevant Health Maintenance data.      Medications: No current outpatient medications on file prior to visit.   No current facility-administered medications on file prior to visit.    Review of Systems: No headache, visual changes, nausea, vomiting, diarrhea,  constipation, dizziness, abdominal pain, skin rash, fevers, chills, night sweats, swollen lymph nodes, weight loss, chest pain, body aches, joint swelling, muscle aches, shortness of breath, mood changes, visual or auditory hallucinations reported.  Objective:   Vitals:   08/10/22 1327  BP: 126/76  Pulse: 64  SpO2: 97%   Vitals:   08/10/22 1327  Weight: 244 lb (110.7 kg)  Height: 4\' 11"  (1.499 m)   Body mass index is 49.28 kg/m.  General: Well Developed, well nourished, and in no acute distress.  Neuro: Alert and oriented x3, extra-ocular muscles intact, sensation grossly intact. Cranial nerves II through XII are grossly intact, motor, sensory, and coordinative functions are intact. HEENT: Normocephalic, atraumatic, pupils equal round reactive to light, neck supple, no masses, no lymphadenopathy, thyroid nonpalpable. Oropharynx, nasopharynx, external ear canals are unremarkable. Skin: Warm and dry, no rashes noted.  Cardiac: Regular rate and rhythm, no murmurs rubs or gallops. No peripheral edema. Pulses symmetric. Respiratory: Clear to auscultation bilaterally. Not using accessory muscles, speaking in full sentences.  Abdominal: Soft, nontender, nondistended, positive bowel sounds, no masses, no organomegaly. Musculoskeletal: Shoulder, elbow, wrist, hip, knee, ankle stable, and with full range of motion.  Female chaperone initials: KG present throughout the physical examination.  Impression and Recommendations:   The patient was counselled, risk factors were discussed, and anticipatory guidance given.  Problem List Items Addressed This Visit       Endocrine   Hypothyroidism    Most recent labs demonstrated subclinical hypothyroidism, continues to remain off of medications, denies  any significant symptomatology, plan for recheck.  Follow labs accordingly.      Relevant Orders   TSH   T3, free   T4, free     Other   Healthcare maintenance - Primary    Annual examination  completed, risk stratification labs ordered, anticipatory guidance provided.  We will follow labs once resulted.      Relevant Orders   CBC   Comprehensive metabolic panel   Hepatitis C antibody   HIV Antibody (routine testing w rflx)   Lipid panel   TSH   VITAMIN D 25 Hydroxy (Vit-D Deficiency, Fractures)   Morbid (severe) obesity due to excess calories (HCC)   Relevant Orders   CBC   Comprehensive metabolic panel   Lipid panel   TSH   VITAMIN D 25 Hydroxy (Vit-D Deficiency, Fractures)   Other Visit Diagnoses     Vitamin D deficiency       Relevant Orders   VITAMIN D 25 Hydroxy (Vit-D Deficiency, Fractures)   Annual physical exam       Relevant Orders   CBC   Comprehensive metabolic panel   Hepatitis C antibody   HIV Antibody (routine testing w rflx)   Lipid panel   TSH   VITAMIN D 25 Hydroxy (Vit-D Deficiency, Fractures)   Screening for lipoid disorders       Relevant Orders   Comprehensive metabolic panel   Lipid panel   Need for hepatitis C screening test       Relevant Orders   Hepatitis C antibody   Screening for HIV (human immunodeficiency virus)       Relevant Orders   HIV Antibody (routine testing w rflx)        Orders & Medications Medications: No orders of the defined types were placed in this encounter.  Orders Placed This Encounter  Procedures   CBC   Comprehensive metabolic panel   Hepatitis C antibody   HIV Antibody (routine testing w rflx)   Lipid panel   TSH   VITAMIN D 25 Hydroxy (Vit-D Deficiency, Fractures)   T3, free   T4, free     Return in about 1 year (around 08/10/2023) for CPE.    Jerrol Banana, MD, [facility]   Primary Care Sports Medicine Primary Care and Sports Medicine at [facility]

## 2022-08-11 LAB — VITAMIN D 25 HYDROXY (VIT D DEFICIENCY, FRACTURES): Vit D, 25-Hydroxy: 7.5 ng/mL — ABNORMAL LOW (ref 30.0–100.0)

## 2022-08-11 LAB — COMPREHENSIVE METABOLIC PANEL
ALT: 35 IU/L — ABNORMAL HIGH (ref 0–32)
AST: 24 IU/L (ref 0–40)
Albumin/Globulin Ratio: 1.8 (ref 1.2–2.2)
Albumin: 4.4 g/dL (ref 4.0–5.0)
Alkaline Phosphatase: 66 IU/L (ref 44–121)
BUN/Creatinine Ratio: 16 (ref 9–23)
BUN: 12 mg/dL (ref 6–20)
Bilirubin Total: 0.3 mg/dL (ref 0.0–1.2)
CO2: 23 mmol/L (ref 20–29)
Calcium: 10.2 mg/dL (ref 8.7–10.2)
Chloride: 101 mmol/L (ref 96–106)
Creatinine, Ser: 0.75 mg/dL (ref 0.57–1.00)
Globulin, Total: 2.5 g/dL (ref 1.5–4.5)
Glucose: 100 mg/dL — ABNORMAL HIGH (ref 70–99)
Potassium: 4.5 mmol/L (ref 3.5–5.2)
Sodium: 138 mmol/L (ref 134–144)
Total Protein: 6.9 g/dL (ref 6.0–8.5)
eGFR: 110 mL/min/{1.73_m2} (ref >59)

## 2022-08-11 LAB — LIPID PANEL
Chol/HDL Ratio: 3.7 ratio (ref 0.0–4.4)
Cholesterol, Total: 200 mg/dL — ABNORMAL HIGH (ref 100–199)
HDL: 54 mg/dL (ref >39)
LDL Chol Calc (NIH): 121 mg/dL — ABNORMAL HIGH (ref 0–99)
Triglycerides: 140 mg/dL (ref 0–149)
VLDL Cholesterol Cal: 25 mg/dL (ref 5–40)

## 2022-08-11 LAB — CBC
Hematocrit: 36.5 % (ref 34.0–46.6)
Hemoglobin: 12.2 g/dL (ref 11.1–15.9)
MCH: 28.8 pg (ref 26.6–33.0)
MCHC: 33.4 g/dL (ref 31.5–35.7)
MCV: 86 fL (ref 79–97)
Platelets: 377 10*3/uL (ref 150–450)
RBC: 4.24 x10E6/uL (ref 3.77–5.28)
RDW: 14.1 % (ref 11.7–15.4)
WBC: 5.8 10*3/uL (ref 3.4–10.8)

## 2022-08-11 LAB — TSH: TSH: 4.78 u[IU]/mL — ABNORMAL HIGH (ref 0.450–4.500)

## 2022-08-11 LAB — T3, FREE: T3, Free: 3 pg/mL (ref 2.0–4.4)

## 2022-08-11 LAB — HIV ANTIBODY (ROUTINE TESTING W REFLEX): HIV Screen 4th Generation wRfx: NONREACTIVE

## 2022-08-11 LAB — HEPATITIS C ANTIBODY: Hep C Virus Ab: NONREACTIVE

## 2022-08-11 LAB — T4, FREE: Free T4: 1.1 ng/dL (ref 0.82–1.77)

## 2022-08-24 ENCOUNTER — Other Ambulatory Visit: Payer: Self-pay

## 2022-08-24 ENCOUNTER — Other Ambulatory Visit: Payer: Self-pay | Admitting: Family Medicine

## 2022-08-24 DIAGNOSIS — E038 Other specified hypothyroidism: Secondary | ICD-10-CM

## 2022-08-24 DIAGNOSIS — E559 Vitamin D deficiency, unspecified: Secondary | ICD-10-CM

## 2022-08-24 MED ORDER — VITAMIN D (ERGOCALCIFEROL) 1.25 MG (50000 UNIT) PO CAPS
50000.0000 [IU] | ORAL_CAPSULE | ORAL | 0 refills | Status: DC
Start: 2022-08-24 — End: 2023-05-23

## 2022-08-24 NOTE — Progress Notes (Signed)
Order placed

## 2022-10-28 ENCOUNTER — Ambulatory Visit (INDEPENDENT_AMBULATORY_CARE_PROVIDER_SITE_OTHER): Payer: 59 | Admitting: Family Medicine

## 2022-10-28 VITALS — BP 122/78 | HR 99 | Ht 59.0 in | Wt 247.0 lb

## 2022-10-28 DIAGNOSIS — M62838 Other muscle spasm: Secondary | ICD-10-CM

## 2022-10-28 MED ORDER — PREDNISONE 50 MG PO TABS
50.0000 mg | ORAL_TABLET | Freq: Every day | ORAL | 0 refills | Status: DC
Start: 2022-10-28 — End: 2023-05-23

## 2022-10-28 MED ORDER — CYCLOBENZAPRINE HCL 10 MG PO TABS: 10.0000 mg | ORAL_TABLET | Freq: Three times a day (TID) | ORAL | 0 refills | Status: DC | PRN

## 2022-10-28 MED ORDER — DICLOFENAC POTASSIUM 50 MG PO TABS
50.0000 mg | ORAL_TABLET | Freq: Three times a day (TID) | ORAL | 0 refills | Status: DC
Start: 2022-10-28 — End: 2023-05-23

## 2022-10-31 DIAGNOSIS — M62838 Other muscle spasm: Secondary | ICD-10-CM | POA: Insufficient documentation

## 2022-10-31 NOTE — Patient Instructions (Signed)
-   diclofenac twice daily with food - cyclobenzaprine nightly and as-needed (side effect drowsiness) - Can start prednisone if symptoms do not improve into the weekend / early next week - Start home exercises and focus on gentle advance - Contact us for any persistent symptoms

## 2022-10-31 NOTE — Assessment & Plan Note (Addendum)
Few day history of progressive, atraumatic left > right paraspinal neck / shoulder pain. Denies radiation, no paresthesias, no weakness. 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 Describes prior epsiodes for which she has seen chiropractpor in the past.  Examination with limited cervical AROM due to pain, symmetric sensorimotor function in bilateral upper extremities with negatieve Spurling's. Tenderness about left upper trapezius and levator scapula.  Plan as follows: - diclofenac twice daily with food - cyclobenzaprine nightly and as-needed (side effect drowsiness) - Can start prednisone if symptoms do not improve into the weekend / early next week - Start home exercises and focus on gentle advance - Contact us for any persistent symptoms - X-rays to be considered at that time

## 2022-10-31 NOTE — Progress Notes (Signed)
     Primary Care / Sports Medicine Office Visit  Patient Information:  Patient ID: Frances May, female DOB: 1995-10-25 Age: 27 y.o. MRN: [id number]   Frances May is a pleasant 27 y.o. female presenting with the following:  Chief Complaint  Patient presents with   Neck Pain    Left side woke up Friday morning, thought she slept funny now worst    Vitals:   10/28/22 1452  BP: 122/78  Pulse: 99  SpO2: 99%   Vitals:   10/28/22 1452  Weight: 247 lb (112 kg)  Height: 4\' 11"  (1.499 m)   Body mass index is 49.89 kg/m.  No results found.   Independent interpretation of notes and tests performed by another provider:   None  Procedures performed:   None  Pertinent History, Exam, Impression, and Recommendations:   Frances May was seen today for neck pain.  Cervical paraspinal muscle spasm Assessment & Plan: Few day history of progressive, atraumatic left > right paraspinal neck / shoulder pain. Denies radiation, no paresthesias, no weakness. 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 Describes prior epsiodes for which she has seen chiropractpor in the past.  Examination with limited cervical AROM due to pain, symmetric sensorimotor function in bilateral upper extremities with negatieve Spurling's. Tenderness about left upper trapezius and levator scapula.  Plan as follows: - diclofenac twice daily with food - cyclobenzaprine nightly and as-needed (side effect drowsiness) - Can start prednisone if symptoms do not improve into the weekend / early next week - Start home exercises and focus on gentle advance - Contact us for any persistent symptoms - X-rays to be considered at that time  Orders: -     Diclofenac Potassium; Take 1 tablet (50 mg total) by mouth 3 (three) times daily.  Dispense: 30 tablet; Refill: 0 -     predniSONE; Take 1 tablet (50 mg total) by mouth daily.  Dispense: 5 tablet; Refill: 0 -     Cyclobenzaprine HCl; Take 1 tablet  (10 mg total) by mouth 3 (three) times daily as needed for muscle spasms.  Dispense: 30 tablet; Refill: 0     Orders & Medications Meds ordered this encounter  Medications   diclofenac (CATAFLAM) 50 MG tablet    Sig: Take 1 tablet (50 mg total) by mouth 3 (three) times daily.    Dispense:  30 tablet    Refill:  0   predniSONE (DELTASONE) 50 MG tablet    Sig: Take 1 tablet (50 mg total) by mouth daily.    Dispense:  5 tablet    Refill:  0   cyclobenzaprine (FLEXERIL) 10 MG tablet    Sig: Take 1 tablet (10 mg total) by mouth 3 (three) times daily as needed for muscle spasms.    Dispense:  30 tablet    Refill:  0   No orders of the defined types were placed in this encounter.    No follow-ups on file.     Jerrol Banana, MD, [facility]   Primary Care Sports Medicine Primary Care and Sports Medicine at [facility]

## 2023-05-23 ENCOUNTER — Encounter: Payer: Self-pay | Admitting: Emergency Medicine

## 2023-05-23 ENCOUNTER — Ambulatory Visit
Admission: EM | Admit: 2023-05-23 | Discharge: 2023-05-23 | Disposition: A | Discharge status: Home / Self Care | Payer: 59 | Attending: Family Medicine | Admitting: Family Medicine

## 2023-05-23 ENCOUNTER — Ambulatory Visit (INDEPENDENT_AMBULATORY_CARE_PROVIDER_SITE_OTHER): Payer: 59

## 2023-05-23 DIAGNOSIS — Z20828 Contact with and (suspected) exposure to other viral communicable diseases: Secondary | ICD-10-CM | POA: Diagnosis present

## 2023-05-23 DIAGNOSIS — J989 Respiratory disorder, unspecified: Secondary | ICD-10-CM

## 2023-05-23 DIAGNOSIS — R509 Fever, unspecified: Secondary | ICD-10-CM | POA: Diagnosis present

## 2023-05-23 LAB — GROUP A STREP BY PCR: Group A Strep by PCR: NOT DETECTED

## 2023-05-23 LAB — PREGNANCY, URINE: Preg Test, Ur: NEGATIVE

## 2023-05-23 LAB — RESP PANEL BY RT-PCR (FLU A&B, COVID) ARPGX2
Influenza A by PCR: NEGATIVE
Influenza B by PCR: NEGATIVE
SARS Coronavirus 2 by RT PCR: NEGATIVE

## 2023-05-23 MED ORDER — IBUPROFEN 800 MG PO TABS
800.0000 mg | ORAL_TABLET | Freq: Once | ORAL | Status: AC
  Administered 2023-05-23: 800 mg via ORAL

## 2023-05-23 MED ORDER — OSELTAMIVIR PHOSPHATE 75 MG PO CAPS: 75.0000 mg | ORAL_CAPSULE | Freq: Two times a day (BID) | ORAL | 0 refills | Status: DC

## 2023-05-23 MED ORDER — ONDANSETRON 4 MG PO TBDP: 4.0000 mg | ORAL_TABLET | Freq: Three times a day (TID) | ORAL | 0 refills | Status: DC | PRN

## 2023-05-23 NOTE — ED Provider Notes (Signed)
 [facility]    CSN: [id number] Arrival date & time: 05/23/23  1051      History   Chief Complaint Chief Complaint  Patient presents with   Nasal Congestion   Cough   Emesis    HPI Frances May is a 28 y.o. female.   HPI  History obtained from the patient. Frances May presents for productive cough, body aches, high heart rate, dizziness, fever, vomiting that started yesterday.  She has chronic diarrhea due to IBS. She didn't take her temperature but felt warm. No treatment prior to arrival.   Her mom is sick too.    No history asthma.  Denies current smoking or vaping but did smoke in the past.    Past Medical History:  Diagnosis Date   Anxiety    GERD (gastroesophageal reflux disease)    Healthcare maintenance 08/10/2022   Hypothyroidism 2020    Patient Active Problem List   Diagnosis Date Noted   Cervical paraspinal muscle spasm 10/31/2022   Healthcare maintenance 08/10/2022   Morbid (severe) obesity due to excess calories (HCC) 06/29/2022   GERD (gastroesophageal reflux disease) 06/29/2022   Hypothyroidism 08/18/2021   Localized edema 08/18/2021   Ganglion of left wrist 05/25/2016    Past Surgical History:  Procedure Laterality Date   GANGLION CYST EXCISION Left 06/02/2016   Procedure: REMOVAL GANGLION OF WRIST;  Surgeon: Erin Sons, MD;  Location: [facility] ORS;  Service: Orthopedics;  Laterality: Left;   NO PAST SURGERIES     WISDOM TOOTH EXTRACTION      OB History   No obstetric history on file.      Home Medications    Prior to Admission medications   Medication Sig Start Date End Date Taking? Authorizing Provider  ondansetron (ZOFRAN-ODT) 4 MG disintegrating tablet Take 1 tablet (4 mg total) by mouth every 8 (eight) hours as needed. 05/23/23  Yes Katha Cabal, DO    Family History Family History  Problem Relation Age of Onset   Obesity Mother    Alcohol abuse Father     Social History Social History   Tobacco Use    Smoking status: Former   Smokeless tobacco: Never  Advertising account planner   Vaping status: Never Used  Substance Use Topics   Alcohol use: Never   Drug use: Never     Allergies   Other   Review of Systems Review of Systems: negative unless otherwise stated in HPI.      Physical Exam Triage Vital Signs ED Triage Vitals  Encounter Vitals Group     BP 05/23/23 1057 (!) 162/90     Systolic BP Percentile --      Diastolic BP Percentile --      Pulse Rate 05/23/23 1057 (!) 142     Resp 05/23/23 1057 18     Temp 05/23/23 1057 (!) 100.5 F (38.1 C)     Temp Source 05/23/23 1057 Oral     SpO2 05/23/23 1057 97 %     Weight --      Height --      Head Circumference --      Peak Flow --      Pain Score 05/23/23 1102 0     Pain Loc --      Pain Education --      Exclude from Growth Chart --    No data found.  Updated Vital Signs BP (!) 162/90 (BP Location: Right Arm)   Pulse (!) 130   Temp (!)  100.9 F (38.3 C) (Oral)   Resp 18   SpO2 97%   Visual Acuity Right Eye Distance:   Left Eye Distance:   Bilateral Distance:    Right Eye Near:   Left Eye Near:    Bilateral Near:     Physical Exam GEN:     alert, ill but non-toxic appearing female in no distress    HENT:  mucus membranes moist, oropharyngeal without lesions or erythema, no tonsillar hypertrophy or exudates EYES:   no scleral injection or discharge NECK:  normal ROM, no meningismus   RESP:  no increased work of breathing, coarse breathe sounds bilaterally CVS:   regular rhythm, tachycardic Skin:   warm and dry, no rash on visible skin    UC Treatments / Results  Labs (all labs ordered are listed, but only abnormal results are displayed) Labs Reviewed  RESP PANEL BY RT-PCR (FLU A&B, COVID) ARPGX2  GROUP A STREP BY PCR  PREGNANCY, URINE    EKG   Radiology DG Chest 2 View Result Date: 05/23/2023 CLINICAL DATA:  Cough, fever EXAM: CHEST - 2 VIEW COMPARISON:  None Available. FINDINGS: The heart size and  mediastinal contours are within normal limits. Both lungs are clear. The visualized skeletal structures are unremarkable. IMPRESSION: No active cardiopulmonary disease. Electronically Signed   By: Annia Belt M.D.   On: 05/23/2023 14:36     Procedures Procedures (including critical care time)  Medications Ordered in UC Medications  ibuprofen (ADVIL) tablet 800 mg (800 mg Oral Given 05/23/23 1109)    Initial Impression / Assessment and Plan / UC Course  I have reviewed the triage vital signs and the nursing notes.  Pertinent labs & imaging results that were available during my care of the patient were reviewed by me and considered in my medical decision making (see chart for details).       Pt is a 28 y.o. female who presents for 1 day of flu-like symptoms. Frances May is tachycardic and febrile here. Given Motrin 800 mg.  Satting well on room air.   EKG showing sinus tachycardia with age-indeterminate anterior infarct without acute ST or T wave changes, possible LVH, personally interpreted by me; compared to 05/09/2007 sinus arrhythmia has resolved and possible anterior infarct is new.  She has not had a period in a while there for urine pregnancy obtained for chest imaging.  Chest xray personally reviewed by me without focal pneumonia, pleural effusion, cardiomegaly or pneumothorax.  Radiologist impression reviewed.  Overall pt is ill but non-toxic appearing, well hydrated, without respiratory distress. Pulmonary exam is remarkable for coarse breathe sounds.  COVID and influenza panel obtained and was negative.  Strep PCR is negative.  Surprisingly, her mother's influenza test is positive here.  I am unsure if they got a good swab so we will treat for influenza as she is within the treatment window as all of the influenza symptoms.  Zofran also prescribed.  Discussed symptomatic treatment.  Typical duration of symptoms discussed.   Return and ED precautions given and voiced understanding.  Discussed MDM, treatment plan and plan for follow-up with patient who agrees with plan.     Final Clinical Impressions(s) / UC Diagnoses   Final diagnoses:  Respiratory illness with fever  Exposure to influenza     Discharge Instructions      Your strep, COVID and influenza tests are all negative.  Stop by the pharmacy to pick up your prescriptions.  Follow up with your primary care provider  as needed.      ED Prescriptions     Medication Sig Dispense Auth. Provider   ondansetron (ZOFRAN-ODT) 4 MG disintegrating tablet Take 1 tablet (4 mg total) by mouth every 8 (eight) hours as needed. 20 tablet Katha Cabal, DO      PDMP not reviewed this encounter.   Katha Cabal, DO 05/30/23 1233

## 2023-05-23 NOTE — Discharge Instructions (Addendum)
Your strep, COVID and influenza tests are all negative.  Stop by the pharmacy to pick up your prescriptions.  Follow up with your primary care provider as needed.

## 2023-05-23 NOTE — ED Triage Notes (Signed)
Patient presents with c/o nasal congestion, non productive cough, and elevated HR  since yesterday. Denies chest, dizziness or any other symptoms at this time.

## 2023-07-05 ENCOUNTER — Encounter: Payer: Self-pay | Admitting: Family Medicine

## 2023-07-05 ENCOUNTER — Ambulatory Visit (INDEPENDENT_AMBULATORY_CARE_PROVIDER_SITE_OTHER): Admitting: Family Medicine

## 2023-07-05 VITALS — BP 112/76 | HR 99 | Ht 59.0 in | Wt 256.0 lb

## 2023-07-05 DIAGNOSIS — K529 Noninfective gastroenteritis and colitis, unspecified: Secondary | ICD-10-CM | POA: Diagnosis not present

## 2023-07-05 DIAGNOSIS — M722 Plantar fascial fibromatosis: Secondary | ICD-10-CM | POA: Insufficient documentation

## 2023-07-05 MED ORDER — DICLOFENAC SODIUM 75 MG PO TBEC: 75.0000 mg | DELAYED_RELEASE_TABLET | Freq: Two times a day (BID) | ORAL | 0 refills | Status: DC
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
## 2023-07-05 NOTE — Assessment & Plan Note (Addendum)
  Chronic Diarrhea Chronic diarrhea with gastroesophageal reflux disease (GERD). Reports diarrhea as fairly normal and not significantly interfering with daily life. Possibility of IBS-D or enzyme deficiencies contributing to symptoms. Has not seen a gastroenterologist for this issue. Discussed potential dietary modifications and the importance of monitoring gastrointestinal symptoms. - Provide information on a low FODMAP diet to manage symptoms - Advise to monitor gastrointestinal symptoms and report any changes - Discuss potential referral to gastroenterology as symptoms dictate

## 2023-07-05 NOTE — Patient Instructions (Signed)
 Patient Plan for Post-Visit Guidance  1. Chronic Diarrhea Management:    - Follow a low FODMAP diet to help manage symptoms.    - Monitor your gastrointestinal symptoms and report any changes.    - Discuss with your healthcare provider the possibility of seeing a gastroenterologist if symptoms dictate.  2. Plantar Fasciitis Treatment:    - Take diclofenac, 75 mg, twice a day for one week for inflammation, then twice a day as needed. Always take with food.    - Use an air heel Aircast brace to reduce pressure on the plantar fascia.    - Avoid using orthotics until symptoms improve.    - Contact your healthcare provider if there is no improvement after 1 to 2 weeks.    - Consider a cortisone injection if symptoms do not improve with medication.    - Begin exercises for the plantar fascia and posterior chain once symptoms improve.  3. Important Symptoms to Watch For:    - If you experience increased pain, swelling, or any new symptoms, please contact your healthcare provider promptly.  Please follow these steps and consult your healthcare providers as needed for the best management of your condition.
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
## 2023-08-15 ENCOUNTER — Encounter: Payer: Self-pay | Admitting: Family Medicine

## 2023-08-16 ENCOUNTER — Encounter: Payer: Self-pay | Admitting: Family Medicine

## 2023-08-16 ENCOUNTER — Ambulatory Visit (INDEPENDENT_AMBULATORY_CARE_PROVIDER_SITE_OTHER): Admitting: Family Medicine

## 2023-08-16 ENCOUNTER — Other Ambulatory Visit (INDEPENDENT_AMBULATORY_CARE_PROVIDER_SITE_OTHER): Payer: Self-pay | Admitting: Radiology

## 2023-08-16 VITALS — BP 114/86 | HR 85 | Ht 59.0 in | Wt 253.6 lb

## 2023-08-16 DIAGNOSIS — M722 Plantar fascial fibromatosis: Secondary | ICD-10-CM | POA: Diagnosis not present

## 2023-08-16 MED ORDER — TRIAMCINOLONE ACETONIDE 40 MG/ML IJ SUSP
40.0000 mg | Freq: Once | INTRAMUSCULAR | Status: AC
  Administered 2023-08-16: 40 mg via INTRAMUSCULAR

## 2023-08-16 MED ORDER — DICLOFENAC SODIUM 75 MG PO TBEC: 75.0000 mg | DELAYED_RELEASE_TABLET | Freq: Two times a day (BID) | ORAL | 0 refills | Status: DC | PRN

## 2023-08-29 NOTE — Patient Instructions (Signed)
 Patient Action Plan  1. Post-Procedure Care:    - Apply ice to the affected area for 20 minutes twice daily.    - Rest your foot as much as possible for two days.  2. Work Note: You have a note for light duty at work for two days after the procedure.  3. Symptom Monitoring: Report any ongoing symptoms or if the pain shifts after two weeks.  4. Night Splint and Exercises: Use a night splint and start stretching exercises once your symptoms improve.  5. Ankle and Foot Pain:    - Watch how your ankle and foot pain responds to the cortisone injection.    - Begin physical therapy, including massage and stretching, when you feel ready.  Red Flags: Seek medical attention if you experience severe pain, increased swelling, redness, or warmth in the foot or ankle, or if you develop a fever.

## 2023-08-29 NOTE — Assessment & Plan Note (Signed)
 History of Present Illness Frances May is a 28 year old female who presents with persistent heel pain due to plantar fasciitis.  She experiences persistent heel pain primarily at the bottom of her heel since April 1st, with ongoing pain despite reduced swelling. Orthotics are part of her treatment plan. She also has soreness at the insertion of the posterior tibialis tendon at the midfoot and anterior ankle joint line pain. New foot spasms are worsening, significantly impacting her ability to work, as her feet hurt after an hour of standing. She previously used diclofenac  for pain management but discontinued it due to tinnitus.  Physical Exam PALPATION: ATFL is non-tender. Mild tenderness over the CFL and calcaneus. Gastrocnemius is non-tender. Anterior ankle joint line pain is present. Tenderness at the insertion of the posterior tibialis tendon at the midfoot.  Assessment and Plan Plantar fasciitis Chronic plantar fasciitis with persistent heel pain. Limited NSAID options due to tinnitus from diclofenac . Cortisone injection preferred for temporary relief to facilitate exercises. - Administer cortisone injection with ultrasound guidance to the plantar fascia. - Prescribe anxiolytic and analgesic for procedural anxiety and pain management. - Advise post-procedure care: ice application for 20 minutes twice daily and relative rest for two days. - Provide work note for two days of light duty post-procedure. - Instruct to report any residual symptoms or pain shift after two weeks. - Encourage use of night splint and initiation of stretching exercises once symptoms allow.  Pain in ankle and foot Persistent anterior ankle joint line pain and posterior tibialis tendon tenderness. Cortisone injection for plantar fasciitis may alleviate some symptoms. Non-medication treatments discussed. - Monitor response to cortisone injection for plantar fasciitis, as it may alleviate some ankle and foot pain. -  Advise on physical therapy for non-medication based treatment options, including massage and stretching, once symptoms allow.

## 2023-08-29 NOTE — Progress Notes (Signed)
 Primary Care / Sports Medicine Office Visit  Patient Information:  Patient ID: Frances May, female DOB: 17-Nov-1995 Age: 28 y.o. MRN: [id number]   Frances May is a pleasant 28 y.o. female presenting with the following:  Chief Complaint  Patient presents with   Foot Pain    Left foot pain and swelling since last visit on 07/05/23. Patient states the swelling went down but the pain is still there. She has been wearing her orthotics. She states when she is wearing them about a hour in her feet are burning L>R. Pain is generally in the arch of her foot.     Vitals:   08/16/23 1514  BP: 114/86  Pulse: 85  SpO2: 98%   Vitals:   08/16/23 1514  Weight: 253 lb 9.6 oz (115 kg)  Height: 4\' 11"  (1.499 m)   Body mass index is 51.22 kg/m.  No results found.   Independent interpretation of notes and tests performed by another provider:   None  Procedures performed:   Procedure:  Injection of left foot under ultrasound guidance. Ultrasound guidance utilized for in-plane approach lateral to medial to plantar fascia at calcaneus Samsung HS60 device utilized with permanent recording / reporting. Verbal informed consent obtained and verified. Skin prepped in a sterile fashion. Ethyl chloride for topical local analgesia.  Completed without difficulty and tolerated well. Medication: triamcinolone  acetonide 40 mg/mL suspension for injection 1 mL total and 1 mL lidocaine  1% without epinephrine utilized for needle placement anesthetic Advised to contact for fevers/chills, erythema, induration, drainage, or persistent bleeding.   Pertinent History, Exam, Impression, and Recommendations:   Problem List Items Addressed This Visit     Plantar fasciitis, left - Primary   History of Present Illness Frances May is a 28 year old female who presents with persistent heel pain due to plantar fasciitis.  She experiences persistent heel pain primarily at the bottom of her heel since  April 1st, with ongoing pain despite reduced swelling. Orthotics are part of her treatment plan. She also has soreness at the insertion of the posterior tibialis tendon at the midfoot and anterior ankle joint line pain. New foot spasms are worsening, significantly impacting her ability to work, as her feet hurt after an hour of standing. She previously used diclofenac  for pain management but discontinued it due to tinnitus.  Physical Exam PALPATION: ATFL is non-tender. Mild tenderness over the CFL and calcaneus. Gastrocnemius is non-tender. Anterior ankle joint line pain is present. Tenderness at the insertion of the posterior tibialis tendon at the midfoot.  Assessment and Plan Plantar fasciitis Chronic plantar fasciitis with persistent heel pain. Limited NSAID options due to tinnitus from diclofenac . Cortisone injection preferred for temporary relief to facilitate exercises. - Administer cortisone injection with ultrasound guidance to the plantar fascia. - Prescribe anxiolytic and analgesic for procedural anxiety and pain management. - Advise post-procedure care: ice application for 20 minutes twice daily and relative rest for two days. - Provide work note for two days of light duty post-procedure. - Instruct to report any residual symptoms or pain shift after two weeks. - Encourage use of night splint and initiation of stretching exercises once symptoms allow.  Pain in ankle and foot Persistent anterior ankle joint line pain and posterior tibialis tendon tenderness. Cortisone injection for plantar fasciitis may alleviate some symptoms. Non-medication treatments discussed. - Monitor response to cortisone injection for plantar fasciitis, as it may alleviate some ankle and foot pain. - Advise on physical therapy for non-medication based  treatment options, including massage and stretching, once symptoms allow.      Relevant Medications   diclofenac  (VOLTAREN ) 75 MG EC tablet   Other Relevant  Orders   US  LIMITED JOINT SPACE STRUCTURES LOW LEFT     Orders & Medications Medications:  Meds ordered this encounter  Medications   triamcinolone  acetonide (KENALOG -40) injection 40 mg   diclofenac  (VOLTAREN ) 75 MG EC tablet    Sig: Take 1 tablet (75 mg total) by mouth 2 (two) times daily as needed.    Dispense:  30 tablet    Refill:  0   Orders Placed This Encounter  Procedures   US  LIMITED JOINT SPACE STRUCTURES LOW LEFT     No follow-ups on file.     Ma Saupe, MD, [facility]   Primary Care Sports Medicine Primary Care and Sports Medicine at [facility]

## 2023-09-08 ENCOUNTER — Encounter: Payer: Self-pay | Admitting: Family Medicine

## 2023-09-12 ENCOUNTER — Other Ambulatory Visit: Payer: Self-pay | Admitting: Family Medicine

## 2023-09-12 ENCOUNTER — Encounter: Payer: Self-pay | Admitting: Family Medicine

## 2023-09-12 ENCOUNTER — Ambulatory Visit (INDEPENDENT_AMBULATORY_CARE_PROVIDER_SITE_OTHER): Payer: Self-pay | Admitting: Family Medicine

## 2023-09-12 VITALS — BP 112/78 | HR 91 | Ht 59.0 in | Wt 248.6 lb

## 2023-09-12 DIAGNOSIS — E038 Other specified hypothyroidism: Secondary | ICD-10-CM

## 2023-09-12 DIAGNOSIS — F329 Major depressive disorder, single episode, unspecified: Secondary | ICD-10-CM

## 2023-09-12 DIAGNOSIS — W540XXA Bitten by dog, initial encounter: Secondary | ICD-10-CM | POA: Insufficient documentation

## 2023-09-12 DIAGNOSIS — Z634 Disappearance and death of family member: Secondary | ICD-10-CM | POA: Insufficient documentation

## 2023-09-12 DIAGNOSIS — M722 Plantar fascial fibromatosis: Secondary | ICD-10-CM

## 2023-09-12 DIAGNOSIS — S61451A Open bite of right hand, initial encounter: Secondary | ICD-10-CM

## 2023-09-12 HISTORY — DX: Major depressive disorder, single episode, unspecified: F32.9

## 2023-09-12 MED ORDER — GABAPENTIN 300 MG PO CAPS: 300.0000 mg | ORAL_CAPSULE | Freq: Three times a day (TID) | ORAL | 0 refills | Status: DC | PRN

## 2023-09-12 MED ORDER — TRAZODONE HCL 50 MG PO TABS: 25.0000 mg | ORAL_TABLET | Freq: Every evening | ORAL | 0 refills | Status: DC | PRN

## 2023-09-12 MED ORDER — PREDNISONE 20 MG PO TABS: 20.0000 mg | ORAL_TABLET | Freq: Two times a day (BID) | ORAL | 0 refills | Status: AC

## 2023-09-12 NOTE — Patient Instructions (Signed)
 Patient Action Plan  1. Bereavement:    - Take trazodone 50 mg at night as needed for sleep.    - Monitor for side effects of over-the-counter sleep aids and consider switching to trazodone.    - Contact your healthcare provider if symptoms persist or worsen.  2. Dog Bite of Right Hand:    - Continue taking prescribed antibiotics.    - Stop using Band-Aids once the drainage stops and the wound is sealed.    - Be aware of infection signs such as increased redness, swelling, or pus, and seek medical care if these occur.  3. Plantar Fasciitis:    - Take prednisone  twice daily with food for 7 days.    - Stop taking diclofenac  during the prednisone  course; you can restart it after completing prednisone  if needed.    - Start gabapentin for nerve pain, up to three times daily as needed, beginning mid to end of the week. Be aware that it may cause drowsiness.    - Continue using a night splint and performing gentle foot stretches.    - Report your progress via MyChart.  4. Subclinical Hypothyroidism:    - Complete the ordered thyroid function tests (T3, free T4, TSH).    - Follow up on test results to determine if an endocrinology referral is needed.  Red Flags: Seek immediate medical attention if you experience severe pain, swelling, fever, or any unusual symptoms that may indicate an infection or adverse reaction to medication.

## 2023-09-12 NOTE — Assessment & Plan Note (Signed)
 She reports recent stress due to her grandmother's passing and her cat's surgeries, which has affected her sleep. She uses over-the-counter sleep aids like Nyquil but is concerned about side effects. No vaginal discharge while on antibiotics.  Bereavement Increased mood scores with low energy and motivation, likely exacerbated by bereavement. Sleep disturbances present. - Prescribed trazodone 50 mg at night as needed for sleep. - Monitor for side effects of OTC sleep aids and consider switching to trazodone. - Discussed potential for further treatment if symptoms persist or worsen.

## 2023-09-12 NOTE — Assessment & Plan Note (Signed)
 Subclinical hypothyroidism Subclinical hypothyroidism with previous elevated TSH, normal T3 and T4. Reports more energy on levothyroxine. Current status unknown due to medication discontinuation. - Ordered thyroid function tests to reassess current status. - Consider endocrinology referral based on lab results.

## 2023-09-12 NOTE — Assessment & Plan Note (Signed)
 She experiences pain in her right wrist following a dog bite. The dog is up to date on vaccines, and she has been prescribed antibiotics. The wrist hurts on the dorsal aspect and is a bit stiff, but overall feels 'pretty okay.' No fevers or chills are present. There is some blood-tinged drainage from the bite wound, but no malodorous discharge. 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  Physical Exam RANGE OF MOTION: Full active and passive range of motion in the hand without pain or limitation.  No expressed drainage at the transversely oriented 0.75 cm laceration that is actively healing localized at the base of the thenar eminence.  Dog bite of right hand Healing wound on right wrist, no infection signs. Blood-tinged drainage, no purulent discharge or malodor.  Animal was confirmed to be updated on immunizations. - Continue antibiotics as prescribed. - Monitor for yeast infection signs and report vaginal discharge. - Discontinue Band-Aids once drainage ceases and wound seals. - Educated on infection signs and when to seek further care.

## 2023-09-12 NOTE — Progress Notes (Signed)
 Primary Care / Sports Medicine Office Visit  Patient Information:  Patient ID: Frances May, female DOB: 10-29-1995 Age: 28 y.o. MRN: [id number]   Frances May is a pleasant 28 y.o. female presenting with the following:  Chief Complaint  Patient presents with   Foot Pain    Patient presents today for continued left foot pain. She has been taking diclofenac  for the pain and doing the stretches that were given to her along with wearing a night boot to keep her foot stretched during the night but it has not been helping.    Animal Bite    Patient was bit by a terrier dog on 09/11/23. She went to ER for treatment and dog had rabies vaccine.    Vitals:   09/12/23 1346  BP: 112/78  Pulse: 91  SpO2: 94%   Vitals:   09/12/23 1346  Weight: 248 lb 9.6 oz (112.8 kg)  Height: 4\' 11"  (1.499 m)   Body mass index is 50.21 kg/m.  US  LIMITED JOINT SPACE STRUCTURES LOW LEFT Result Date: 08/29/2023 Procedure:  Injection of left foot under ultrasound guidance. Ultrasound guidance utilized for in-plane approach lateral to medial to plantar fascia at calcaneus Samsung HS60 device utilized with permanent recording / reporting. Verbal informed consent obtained and verified. Skin prepped in a sterile fashion. Ethyl chloride for topical local analgesia. Completed without difficulty and tolerated well. Medication: triamcinolone  acetonide 40 mg/mL suspension for injection 1 mL total and 1 mL lidocaine  1% without epinephrine utilized for needle placement anesthetic Advised to contact for fevers/chills, erythema, induration, drainage, or persistent bleeding.    Independent interpretation of notes and tests performed by another provider:   None  Procedures performed:   None  Pertinent History, Exam, Impression, and Recommendations:   Problem List Items Addressed This Visit     Bereavement   She reports recent stress due to her grandmother's passing and her cat's surgeries, which has affected  her sleep. She uses over-the-counter sleep aids like Nyquil but is concerned about side effects. No vaginal discharge while on antibiotics.  Bereavement Increased mood scores with low energy and motivation, likely exacerbated by bereavement. Sleep disturbances present. - Prescribed trazodone 50 mg at night as needed for sleep. - Monitor for side effects of OTC sleep aids and consider switching to trazodone. - Discussed potential for further treatment if symptoms persist or worsen.      Dog bite of right hand - Primary   She experiences pain in her right wrist following a dog bite. The dog is up to date on vaccines, and she has been prescribed antibiotics. The wrist hurts on the dorsal aspect and is a bit stiff, but overall feels 'pretty okay.' No fevers or chills are present. There is some blood-tinged drainage from the bite wound, but no malodorous discharge. 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  Physical Exam RANGE OF MOTION: Full active and passive range of motion in the hand without pain or limitation.  No expressed drainage at the transversely oriented 0.75 cm laceration that is actively healing localized at the base of the thenar eminence.  Dog bite of right hand Healing wound on right wrist, no infection signs. Blood-tinged drainage, no purulent discharge or malodor.  Animal was confirmed to be updated on immunizations. - Continue antibiotics as prescribed. - Monitor for yeast infection signs and report vaginal discharge. - Discontinue Band-Aids once drainage ceases and wound seals. - Educated on infection signs and  when to seek further care.      Plantar fasciitis, left   Regarding her foot pain, she describes the recent left plantar fascia ultrasound-guided corticosteroid injection providing some relief, but she continues to experience pain. Initially, the foot felt numb for a couple of days post-procedure, and there was associated improvement,  particularly in the arch area. However, she still experiences significant pain in other parts of the foot. The pain is persistent and worsens by the end of her workday, even with sitting breaks.  Plantar fasciitis Chronic plantar fasciitis with partial response to previous injection. Persistent arch pain with some improvement.  This most likely represents multiple involved foci in the foot, compensatory in nature.  Additionally, exclude radiation of neuropathic symptoms stemming from the lumbar spine. - Prescribe prednisone  for 7 days, twice daily with food. - Pause diclofenac  during prednisone  course.  Can restart diclofenac  after prednisone  if needed. - Prescribed gabapentin for nerve pain, up to three times daily as needed, start this mid-- end week.  Side effect can be drowsiness. - Continue night splint and gentle stretches. - Monitor response to treatment and report progress via MyChart.      Subclinical hypothyroidism   Subclinical hypothyroidism Subclinical hypothyroidism with previous elevated TSH, normal T3 and T4. Reports more energy on levothyroxine. Current status unknown due to medication discontinuation. - Ordered thyroid function tests to reassess current status. - Consider endocrinology referral based on lab results.      Relevant Orders   T3, free   T4, free   TSH   I provided a total time of 44 minutes including both face-to-face and non-face-to-face time on 09/12/2023 inclusive of time utilized for medical chart review, information gathering, care coordination with staff, and documentation completion.   Orders & Medications Medications:  Meds ordered this encounter  Medications   predniSONE  (DELTASONE ) 20 MG tablet    Sig: Take 1 tablet (20 mg total) by mouth 2 (two) times daily with a meal for 7 days.    Dispense:  14 tablet    Refill:  0   gabapentin (NEURONTIN) 300 MG capsule    Sig: Take 1 capsule (300 mg total) by mouth 3 (three) times daily as needed.     Dispense:  60 capsule    Refill:  0   traZODone (DESYREL) 50 MG tablet    Sig: Take 0.5-1 tablets (25-50 mg total) by mouth at bedtime as needed.    Dispense:  30 tablet    Refill:  0   Orders Placed This Encounter  Procedures   T3, free   T4, free   TSH     No follow-ups on file.     Ma Saupe, MD, [facility]   Primary Care Sports Medicine Primary Care and Sports Medicine at [facility]

## 2023-09-12 NOTE — Assessment & Plan Note (Addendum)
 Regarding her foot pain, she describes the recent left plantar fascia ultrasound-guided corticosteroid injection providing some relief, but she continues to experience pain. Initially, the foot felt numb for a couple of days post-procedure, and there was associated improvement, particularly in the arch area. However, she still experiences significant pain in other parts of the foot. The pain is persistent and worsens by the end of her workday, even with sitting breaks.  Plantar fasciitis Chronic plantar fasciitis with partial response to previous injection. Persistent arch pain with some improvement.  This most likely represents multiple involved foci in the foot, compensatory in nature.  Additionally, exclude radiation of neuropathic symptoms stemming from the lumbar spine. - Prescribe prednisone  for 7 days, twice daily with food. - Pause diclofenac  during prednisone  course.  Can restart diclofenac  after prednisone  if needed. - Prescribed gabapentin for nerve pain, up to three times daily as needed, start this mid-- end week.  Side effect can be drowsiness. - Continue night splint and gentle stretches. - Monitor response to treatment and report progress via MyChart.

## 2023-09-13 ENCOUNTER — Telehealth: Payer: Self-pay

## 2023-09-13 ENCOUNTER — Telehealth: Payer: Self-pay | Admitting: Family Medicine

## 2023-09-13 ENCOUNTER — Ambulatory Visit: Payer: Self-pay | Admitting: Family Medicine

## 2023-09-13 LAB — T3, FREE: T3, Free: 2.9 pg/mL (ref 2.0–4.4)

## 2023-09-13 LAB — T4, FREE: Free T4: 1.34 ng/dL (ref 0.82–1.77)

## 2023-09-13 LAB — TSH: TSH: 3.76 u[IU]/mL (ref 0.450–4.500)

## 2023-09-13 NOTE — Telephone Encounter (Signed)
 Copied from CRM [id number]. Topic: Medical Record Request - Payor/Billing Request >> Sep 13, 2023  1:10 PM Fredrica W wrote: Reason for CRM: Amy called from [employer]. Would like to know if patient was seen. Requesting notes from last appt. Fax: [phone]. Will also fax request. Thank You

## 2023-09-13 NOTE — Transitions of Care (Post Inpatient/ED Visit) (Signed)
   09/13/2023  Name: Nallely Yost MRN: [id number] DOB: 17-Jan-1996  Today's TOC FU Call Status: Today's TOC FU Call Status:: Successful TOC FU Call Completed TOC FU Call Complete Date: 09/13/23 Patient's Name and Date of Birth confirmed.  Transition Care Management Follow-up Telephone Call Date of Discharge: 09/11/23 Discharge Facility: [facility]) Type of Discharge: Emergency Department Reason for ED Visit: Other: (Dog Bite.) How have you been since you were released from the hospital?: Better Any questions or concerns?: No  Items Reviewed: Did you receive and understand the discharge instructions provided?: Yes Medications obtained,verified, and reconciled?: Yes (Medications Reviewed) Any new allergies since your discharge?: No Dietary orders reviewed?: NA Do you have support at home?: Yes  Medications Reviewed Today: Medications Reviewed Today   Medications were not reviewed in this encounter     Home Care and Equipment/Supplies: Were Home Health Services Ordered?: NA Any new equipment or medical supplies ordered?: NA  Functional Questionnaire: Do you need assistance with bathing/showering or dressing?: No Do you need assistance with meal preparation?: No Do you need assistance with eating?: No Do you have difficulty maintaining continence: No Do you need assistance with getting out of bed/getting out of a chair/moving?: No Do you have difficulty managing or taking your medications?: No  Follow up appointments reviewed: PCP Follow-up appointment confirmed?: NA Specialist Hospital Follow-up appointment confirmed?: NA Do you need transportation to your follow-up appointment?: No Do you understand care options if your condition(s) worsen?: Yes-patient verbalized understanding    Chang Tiggs [place]  Primary Care & Sports Medicine at [facility] CMA, AAMA [address]  [place] [address] Office [phone]   Fax: [phone]

## 2023-09-13 NOTE — Telephone Encounter (Signed)
 Faxed over encounter note  JM

## 2023-09-20 ENCOUNTER — Encounter: Payer: Self-pay | Admitting: Family Medicine

## 2023-09-20 NOTE — Telephone Encounter (Signed)
 Please change her preferred name to Madonna in system.  Thanks

## 2023-09-20 NOTE — Telephone Encounter (Signed)
 Please review and advise.   JM

## 2023-09-26 ENCOUNTER — Other Ambulatory Visit: Payer: Self-pay | Admitting: Family Medicine

## 2023-09-26 ENCOUNTER — Ambulatory Visit
Admission: RE | Admit: 2023-09-26 | Discharge: 2023-09-26 | Disposition: A | Discharge status: Home / Self Care | Attending: Family Medicine | Admitting: Family Medicine

## 2023-09-26 ENCOUNTER — Ambulatory Visit
Admission: RE | Admit: 2023-09-26 | Discharge: 2023-09-26 | Disposition: A | Discharge status: Home / Self Care | Source: Ambulatory Visit | Attending: Family Medicine

## 2023-09-26 ENCOUNTER — Other Ambulatory Visit: Payer: Self-pay

## 2023-09-26 ENCOUNTER — Telehealth: Payer: Self-pay | Admitting: Family Medicine

## 2023-09-26 DIAGNOSIS — M25571 Pain in right ankle and joints of right foot: Secondary | ICD-10-CM

## 2023-09-26 DIAGNOSIS — M25572 Pain in left ankle and joints of left foot: Secondary | ICD-10-CM

## 2023-09-26 DIAGNOSIS — M722 Plantar fascial fibromatosis: Secondary | ICD-10-CM

## 2023-09-26 NOTE — Telephone Encounter (Signed)
 Angie from Imaging called from downstairs needing clarification on patient's orders. She stated that pt said she needs imaging on both feet and both ankles, but order states left ankle and right foot along with varies mri's. Angie would like a call back asap to get this resolved. Angie stated she would be in office until 2 pm.  Please call imaging ..@ [phone]. Angie also stated that there were orders from Dr. Alvia and orders from [facility].

## 2023-09-26 NOTE — Telephone Encounter (Signed)
 Please review and advise.   JM

## 2023-09-27 ENCOUNTER — Other Ambulatory Visit: Payer: Self-pay

## 2023-09-27 ENCOUNTER — Ambulatory Visit
Admission: RE | Admit: 2023-09-27 | Discharge: 2023-09-27 | Disposition: A | Discharge status: Home / Self Care | Attending: Family Medicine | Admitting: Family Medicine

## 2023-09-27 ENCOUNTER — Ambulatory Visit
Admission: RE | Admit: 2023-09-27 | Discharge: 2023-09-27 | Disposition: A | Discharge status: Home / Self Care | Source: Ambulatory Visit | Attending: Family Medicine

## 2023-09-27 DIAGNOSIS — M722 Plantar fascial fibromatosis: Secondary | ICD-10-CM

## 2023-09-27 DIAGNOSIS — M25571 Pain in right ankle and joints of right foot: Secondary | ICD-10-CM

## 2023-09-27 NOTE — Telephone Encounter (Signed)
 Orders placed. Spoke with Angie and clarified concerns. New orders placed.  JM

## 2023-09-30 ENCOUNTER — Ambulatory Visit
Admission: RE | Admit: 2023-09-30 | Discharge: 2023-09-30 | Disposition: A | Discharge status: Home / Self Care | Source: Ambulatory Visit | Attending: Family Medicine | Admitting: Family Medicine

## 2023-09-30 ENCOUNTER — Ambulatory Visit
Admission: RE | Admit: 2023-09-30 | Discharge: 2023-09-30 | Disposition: A | Discharge status: Home / Self Care | Source: Ambulatory Visit | Attending: Family Medicine

## 2023-09-30 ENCOUNTER — Ambulatory Visit: Admission: RE | Admit: 2023-09-30 | Source: Ambulatory Visit

## 2023-09-30 DIAGNOSIS — M25571 Pain in right ankle and joints of right foot: Secondary | ICD-10-CM

## 2023-09-30 DIAGNOSIS — M25572 Pain in left ankle and joints of left foot: Secondary | ICD-10-CM | POA: Insufficient documentation

## 2023-09-30 DIAGNOSIS — M722 Plantar fascial fibromatosis: Secondary | ICD-10-CM | POA: Insufficient documentation

## 2023-10-02 ENCOUNTER — Ambulatory Visit: Payer: Self-pay | Admitting: Family Medicine

## 2023-10-06 ENCOUNTER — Other Ambulatory Visit: Payer: Self-pay | Admitting: Family Medicine

## 2023-10-10 NOTE — Telephone Encounter (Signed)
 Requested Prescriptions  Pending Prescriptions Disp Refills   traZODone  (DESYREL ) 50 MG tablet [Pharmacy Med Name: traZODone  HCl 50 MG Oral Tablet] 90 tablet 0    Sig: TAKE 1/2 TO 1 (ONE-HALF TO ONE) TABLET BY MOUTH AT BEDTIME AS NEEDED     Psychiatry: Antidepressants - Serotonin Modulator Passed - 10/10/2023  2:45 PM      Passed - Valid encounter within last 6 months    Recent Outpatient Visits           4 weeks ago Dog bite of right hand, initial encounter   [facility] Medicine at [facility], Selinda PARAS, MD   1 month ago Plantar fasciitis, left   [facility] Care & [facility] Medicine at [facility], Selinda PARAS, MD   3 months ago Plantar fasciitis, left   [facility] Primary Care & Sports Medicine at [facility], Selinda PARAS, MD

## 2023-10-11 ENCOUNTER — Encounter: Payer: Self-pay | Admitting: Family Medicine

## 2023-10-11 ENCOUNTER — Other Ambulatory Visit: Payer: Self-pay | Admitting: Family Medicine

## 2023-10-11 DIAGNOSIS — G4700 Insomnia, unspecified: Secondary | ICD-10-CM

## 2023-10-11 MED ORDER — TRAZODONE HCL 50 MG PO TABS: 50.0000 mg | ORAL_TABLET | Freq: Every evening | ORAL | 0 refills | Status: DC | PRN

## 2023-10-11 NOTE — Telephone Encounter (Signed)
 Please review and advise.   JM

## 2023-10-21 ENCOUNTER — Encounter: Payer: Self-pay | Admitting: Podiatry

## 2023-10-21 ENCOUNTER — Ambulatory Visit: Admitting: Podiatry

## 2023-10-21 VITALS — Ht 59.0 in | Wt 248.6 lb

## 2023-10-21 DIAGNOSIS — M722 Plantar fascial fibromatosis: Secondary | ICD-10-CM

## 2023-10-21 NOTE — Progress Notes (Signed)
   No chief complaint on file.   Subjective: 28 y.o. female presenting today for follow-up evaluation of plantar fasciitis bilateral.  She continues to have pain and tenderness on a daily basis.  This is now been going on for over 2 years.  She has recently had an MRI and x-rays performed.  She has also tried multiple conservative modalities with sports medicine including anti-inflammatory cortisone injections, oral anti-inflammatories, orthotics, shoe gear modifications, with no relief of her symptoms.  She presents today to discuss other treatment options   Past Medical History:  Diagnosis Date   Anxiety    GERD (gastroesophageal reflux disease)    Healthcare maintenance 08/10/2022   Hypothyroidism 2020   Past Surgical History:  Procedure Laterality Date   GANGLION CYST EXCISION Left 06/02/2016   Procedure: REMOVAL GANGLION OF WRIST;  Surgeon: Helayne Glenn, MD;  Location: [facility] ORS;  Service: Orthopedics;  Laterality: Left;   NO PAST SURGERIES     WISDOM TOOTH EXTRACTION     Allergies  Allergen Reactions   Other Other (See Comments)    Cats - make pt's nose itch     Objective: Physical Exam General: The patient is alert and oriented x3 in no acute distress.  Dermatology: Skin is warm, dry and supple bilateral lower extremities. Negative for open lesions or macerations bilateral.   Vascular: Dorsalis Pedis and Posterior Tibial pulses palpable bilateral.  Capillary fill time is immediate to all digits.  Neurological: Grossly intact via light touch  Musculoskeletal: There continues to be tenderness to palpation to the plantar aspect of the bilateral heels along the plantar fascia. All other joints range of motion within normal limits bilateral. Strength 5/5 in all groups bilateral.   DG Foot Complete Left 09/27/2023 IMPRESSION: Negative.  DG Foot Complete Right 09/27/2023 IMPRESSION: Mild plantar and posterior calcaneal heel spurs.  Assessment: 1.  Chronic plantar  fasciitis bilateral feet  Plan of Care:  -Patient evaluated.  Imaging and chart reviewed -Unfortunately patient continues to have chronic heel pain bilaterally which has been ongoing now for a few years.  She has tried multiple conservative modalities with no improvement or relief of her symptoms. -I do believe that surgery is appropriate to discuss at this time.  Unfortunately she has failed multiple conservative treatment modalities.  We discussed endoscopic plantar fasciotomy bilateral.  Risk benefits advantages and disadvantages as well as the postoperative recovery course were explained in length in detail.  All patient questions were answered.  No guarantees were expressed or implied.  The patient consents and would like to pursue the surgical option -Authorization for surgery was initiated today.  Surgery will consist of endoscopic plantar fasciotomy bilateral -Return to clinic 1 week postop  *works at [employer] in [place].  Planning for 8 weeks out of work.  Thresa EMERSON Sar, DPM [facility]  Dr. Thresa EMERSON Sar, DPM    [address] [place], [place] [address]                Office [phone]  Fax [phone]

## 2023-10-31 ENCOUNTER — Encounter: Payer: Self-pay | Admitting: Podiatry

## 2023-11-01 DIAGNOSIS — Z0271 Encounter for disability determination: Secondary | ICD-10-CM

## 2023-11-01 NOTE — Telephone Encounter (Signed)
 DOS 11/10/23 last day of work 11/08/23 and RTW 02/10/24 approx. advised would fax to [employer] and email her copy to email address on file. fit to duty emailed- no fax # for it.

## 2023-11-02 ENCOUNTER — Telehealth: Payer: Self-pay | Admitting: Podiatry

## 2023-11-02 NOTE — Telephone Encounter (Signed)
 ERROR

## 2023-11-02 NOTE — Telephone Encounter (Signed)
 See prev notes Faxed [employer] forms/notes [phone] and emailed pt her copy of all that was faxed to [employer] [email or web address]

## 2023-11-03 ENCOUNTER — Telehealth: Payer: Self-pay | Admitting: Podiatry

## 2023-11-03 NOTE — Telephone Encounter (Signed)
 DOS- 11/10/2023  ENDOSCOPIC PLANTAR FASCIOTOMY BIL- 70106  [employer] EFFECTIVE DATE- 04/06/2023  DEDUCTIBLE- $5700 REMAINING- $0 FAMILY DEDUCTIBLE- $11400 REMAINING- $5700 OOP- $7400 REMAINING- $1478.89 FAMILY OOP- $14800 REMAINING- $8878.89 COINSURANCE- 30% WITH $0 COPAY  PER [employer] WEBSITE, PRIOR AUTH WAS APPROVED FOR CPT CODE [phone] (2x) AUTH # [id number]

## 2023-11-10 ENCOUNTER — Encounter: Payer: Self-pay | Admitting: Podiatry

## 2023-11-10 ENCOUNTER — Other Ambulatory Visit: Payer: Self-pay | Admitting: Podiatry

## 2023-11-10 DIAGNOSIS — M722 Plantar fascial fibromatosis: Secondary | ICD-10-CM | POA: Diagnosis not present

## 2023-11-10 MED ORDER — IBUPROFEN 800 MG PO TABS: 800.0000 mg | ORAL_TABLET | Freq: Three times a day (TID) | ORAL | 1 refills | Status: DC

## 2023-11-10 MED ORDER — OXYCODONE-ACETAMINOPHEN 5-325 MG PO TABS: 1.0000 | ORAL_TABLET | ORAL | 0 refills | Status: DC | PRN

## 2023-11-10 NOTE — Progress Notes (Signed)
 PRN postop

## 2023-11-11 ENCOUNTER — Ambulatory Visit (INDEPENDENT_AMBULATORY_CARE_PROVIDER_SITE_OTHER): Admitting: Podiatry

## 2023-11-11 ENCOUNTER — Telehealth: Payer: Self-pay | Admitting: Podiatry

## 2023-11-11 DIAGNOSIS — M722 Plantar fascial fibromatosis: Secondary | ICD-10-CM

## 2023-11-11 NOTE — Telephone Encounter (Signed)
 Patient is scheduled for bandage change 8/8 /2025 with Dr.Wagoner

## 2023-11-11 NOTE — Telephone Encounter (Signed)
 Patient states she spilled a drink and her bandage needs to be changed. Does she need to come in and get the bandage replaced?

## 2023-11-15 ENCOUNTER — Encounter: Payer: Self-pay | Admitting: Podiatry

## 2023-11-15 DIAGNOSIS — Z0271 Encounter for disability determination: Secondary | ICD-10-CM

## 2023-11-15 NOTE — Telephone Encounter (Signed)
 Recd [employer] form. Completed and faxed [phone]

## 2023-11-15 NOTE — Telephone Encounter (Signed)
 See notes and sending note for her mom that was with her during surgery

## 2023-11-17 NOTE — Progress Notes (Signed)
 Patient was seen by nurse for dressing change.  I did not see the patient.  The top of the dressing was wet and this was changed with the inner bandage was not wet.  Follow-up Dr. Janit as scheduled.

## 2023-11-18 ENCOUNTER — Ambulatory Visit (INDEPENDENT_AMBULATORY_CARE_PROVIDER_SITE_OTHER): Admitting: Podiatry

## 2023-11-18 ENCOUNTER — Encounter: Payer: Self-pay | Admitting: Podiatry

## 2023-11-18 VITALS — Ht 59.0 in | Wt 248.6 lb

## 2023-11-18 DIAGNOSIS — M722 Plantar fascial fibromatosis: Secondary | ICD-10-CM

## 2023-11-18 MED ORDER — OXYCODONE-ACETAMINOPHEN 5-325 MG PO TABS: 1.0000 | ORAL_TABLET | Freq: Three times a day (TID) | ORAL | 0 refills | Status: DC | PRN

## 2023-11-18 NOTE — Addendum Note (Signed)
 Addended by: JANIT THRESA HERO on: 11/18/2023 10:49 AM   Modules accepted: Orders

## 2023-11-18 NOTE — Progress Notes (Signed)
   Chief Complaint  Patient presents with   Routine Post Op    POV # 1 DOS 11/10/23 BIL EPF    Subjective:  Patient presents today status post bilateral endoscopic plantar fasciotomy.  DOS: 11/10/2023.  Doing well.  She is minimally WBAT in surgical shoes  Past Medical History:  Diagnosis Date   Anxiety    GERD (gastroesophageal reflux disease)    Healthcare maintenance 08/10/2022   Hypothyroidism 2020    Past Surgical History:  Procedure Laterality Date   GANGLION CYST EXCISION Left 06/02/2016   Procedure: REMOVAL GANGLION OF WRIST;  Surgeon: Helayne Glenn, MD;  Location: [facility] ORS;  Service: Orthopedics;  Laterality: Left;   NO PAST SURGERIES     WISDOM TOOTH EXTRACTION      Allergies  Allergen Reactions   Other Other (See Comments)    Cats - make pt's nose itch    Objective/Physical Exam Neurovascular status intact.  The incisions to the medial and lateral aspect of the bilateral heels are intact with no drainage.  Sutures are intact.  Clinically there is no indication of infection.  Well-healing surgical foot overall.  No edema or erythema   Assessment: 1. s/p EPF bilateral. DOS: 11/10/2023   Plan of Care:  -Patient was evaluated.  -Dressings removed.  Recommend antibiotic ointment and a Band-Aid over the small incisions daily -Compression ankle sleeves dispensed.  Wear daily -Continue WBAT surgical shoes -Return to clinic 1 week suture removal  Thresa EMERSON Sar, DPM [facility]  Dr. Thresa EMERSON Sar, DPM    [address] [place], [place] [address]                Office [phone]  Fax [phone]

## 2023-11-22 ENCOUNTER — Telehealth: Payer: Self-pay

## 2023-11-22 DIAGNOSIS — Z0271 Encounter for disability determination: Secondary | ICD-10-CM

## 2023-11-22 NOTE — Telephone Encounter (Signed)
 Recd [employer] form requesting last notes. Faxed to [phone]

## 2023-11-22 NOTE — Telephone Encounter (Signed)
 PA request received from CoverMyMeds for Oxycodone -Acetaminophen  5/325mg  tablets. PA submitted and waiting on response.  [address] [place] (Key: [id number]) Rx #: [id number]

## 2023-11-25 ENCOUNTER — Encounter: Payer: Self-pay | Admitting: Podiatry

## 2023-11-25 ENCOUNTER — Ambulatory Visit: Admitting: Podiatry

## 2023-11-25 VITALS — Ht 59.0 in | Wt 248.6 lb

## 2023-11-25 DIAGNOSIS — M722 Plantar fascial fibromatosis: Secondary | ICD-10-CM

## 2023-11-25 NOTE — Progress Notes (Signed)
   Chief Complaint  Patient presents with   Routine Post Op    POV # 2 DOS 11/10/23 BIL EPF, pt states everything is going well has no complaints.     Subjective:  Patient presents today status post bilateral endoscopic plantar fasciotomy.  DOS: 11/10/2023.  Doing well.  No new complaints  Past Medical History:  Diagnosis Date   Anxiety    GERD (gastroesophageal reflux disease)    Healthcare maintenance 08/10/2022   Hypothyroidism 2020    Past Surgical History:  Procedure Laterality Date   GANGLION CYST EXCISION Left 06/02/2016   Procedure: REMOVAL GANGLION OF WRIST;  Surgeon: Helayne Glenn, MD;  Location: [facility] ORS;  Service: Orthopedics;  Laterality: Left;   NO PAST SURGERIES     WISDOM TOOTH EXTRACTION      Allergies  Allergen Reactions   Other Other (See Comments)    Cats - make pt's nose itch    Objective/Physical Exam Neurovascular status intact.  The incisions to the medial and lateral aspect of the bilateral heels are intact with no drainage.  Sutures are intact.  Clinically there is no indication of infection.  Well-healing surgical foot overall.  No edema or erythema   Assessment: 1. s/p EPF bilateral. DOS: 11/10/2023   Plan of Care:  -Patient was evaluated.  - Sutures removed -Slowly transition out of postop shoes into good supportive tennis shoes and sneakers -Recommend light daily stretching exercises which were demonstrated today -Return to clinic 4 weeks  Thresa EMERSON Sar, DPM [facility]  Dr. Thresa EMERSON Sar, DPM    [address] [place], [place] [address]                Office [phone]  Fax [phone]

## 2023-12-06 ENCOUNTER — Encounter: Admitting: Family Medicine

## 2023-12-09 ENCOUNTER — Ambulatory Visit (INDEPENDENT_AMBULATORY_CARE_PROVIDER_SITE_OTHER): Admitting: Podiatry

## 2023-12-09 DIAGNOSIS — M722 Plantar fascial fibromatosis: Secondary | ICD-10-CM

## 2023-12-09 MED ORDER — IBUPROFEN 800 MG PO TABS: 800.0000 mg | ORAL_TABLET | Freq: Three times a day (TID) | ORAL | 1 refills | Status: AC

## 2023-12-09 NOTE — Addendum Note (Signed)
 Addended by: JANIT THRESA HERO on: 12/09/2023 10:07 AM   Modules accepted: Orders

## 2023-12-09 NOTE — Progress Notes (Signed)
   Chief Complaint  Patient presents with   Routine Post Op    POV # 3 DOS 11/10/23 BIL EPF, pt is here to f/u on bilateral feet due to surgery, she states still some pain in both feet but manageable.    Subjective:  Patient presents today status post bilateral endoscopic plantar fasciotomy.  DOS: 11/10/2023.  Doing well.  No new complaints  Past Medical History:  Diagnosis Date   Anxiety    GERD (gastroesophageal reflux disease)    Healthcare maintenance 08/10/2022   Hypothyroidism 2020    Past Surgical History:  Procedure Laterality Date   GANGLION CYST EXCISION Left 06/02/2016   Procedure: REMOVAL GANGLION OF WRIST;  Surgeon: Helayne Glenn, MD;  Location: [facility] ORS;  Service: Orthopedics;  Laterality: Left;   NO PAST SURGERIES     WISDOM TOOTH EXTRACTION      Allergies  Allergen Reactions   Other Other (See Comments)    Cats - make pt's nose itch    Objective/Physical Exam Neurovascular status intact.  Incisions nicely healed.  There continues to be some slight tenderness along the plantar fasciotomy site which is expected   Assessment: 1. s/p EPF bilateral. DOS: 11/10/2023   Plan of Care:  -Patient was evaluated.  - Continue good supportive tennis shoes and sneakers -Continue anti-inflammatory PRN -Return to clinic 3 months  *works at [employer] in [place].  Planning for 8 weeks out of work.   Thresa EMERSON Sar, DPM [facility]  Dr. Thresa EMERSON Sar, DPM    [address] [place], [place] [address]                Office [phone]  Fax [phone]

## 2023-12-12 NOTE — Telephone Encounter (Signed)
 Recd form via fax from [facility]. Faxed back to [facility] office for the pt to pick up

## 2023-12-13 ENCOUNTER — Telehealth: Payer: Self-pay | Admitting: Podiatry

## 2023-12-13 NOTE — Telephone Encounter (Signed)
 Recd form from [employer] requesting last office notes and RTW date. Faxed to [phone]

## 2023-12-19 ENCOUNTER — Other Ambulatory Visit: Payer: Self-pay | Admitting: Family Medicine

## 2023-12-19 DIAGNOSIS — G4700 Insomnia, unspecified: Secondary | ICD-10-CM

## 2023-12-21 NOTE — Telephone Encounter (Signed)
 Requested Prescriptions  Pending Prescriptions Disp Refills   traZODone  (DESYREL ) 50 MG tablet [Pharmacy Med Name: traZODone  HCl 50 MG Oral Tablet] 90 tablet 0    Sig: TAKE 1/2 TO 1 (ONE-HALF TO ONE) TABLET BY MOUTH AT BEDTIME AS NEEDED     Psychiatry: Antidepressants - Serotonin Modulator Passed - 12/21/2023  9:21 AM      Passed - Valid encounter within last 6 months    Recent Outpatient Visits           3 months ago Dog bite of right hand, initial encounter   [facility] Medicine at [facility], Selinda PARAS, MD   4 months ago Plantar fasciitis, left   [facility] Medicine at [facility], Selinda PARAS, MD   5 months ago Plantar fasciitis, left   [facility] Sports Medicine at [facility], Selinda PARAS, MD

## 2024-01-02 ENCOUNTER — Ambulatory Visit (INDEPENDENT_AMBULATORY_CARE_PROVIDER_SITE_OTHER): Admitting: Family Medicine

## 2024-01-02 ENCOUNTER — Encounter: Payer: Self-pay | Admitting: Family Medicine

## 2024-01-02 VITALS — BP 116/72 | HR 86 | Ht 59.0 in | Wt 243.0 lb

## 2024-01-02 DIAGNOSIS — M722 Plantar fascial fibromatosis: Secondary | ICD-10-CM | POA: Diagnosis not present

## 2024-01-02 DIAGNOSIS — Z0000 Encounter for general adult medical examination without abnormal findings: Secondary | ICD-10-CM | POA: Diagnosis not present

## 2024-01-02 DIAGNOSIS — K219 Gastro-esophageal reflux disease without esophagitis: Secondary | ICD-10-CM

## 2024-01-02 DIAGNOSIS — Z0279 Encounter for issue of other medical certificate: Secondary | ICD-10-CM

## 2024-01-02 DIAGNOSIS — F320 Major depressive disorder, single episode, mild: Secondary | ICD-10-CM

## 2024-01-02 DIAGNOSIS — E038 Other specified hypothyroidism: Secondary | ICD-10-CM | POA: Diagnosis not present

## 2024-01-02 DIAGNOSIS — G4700 Insomnia, unspecified: Secondary | ICD-10-CM

## 2024-01-02 MED ORDER — BUPROPION HCL ER (XL) 150 MG PO TB24: 150.0000 mg | ORAL_TABLET | Freq: Every day | ORAL | 0 refills | Status: DC

## 2024-01-02 MED ORDER — TRAZODONE HCL 50 MG PO TABS: 50.0000 mg | ORAL_TABLET | Freq: Every evening | ORAL | 0 refills | Status: DC | PRN

## 2024-01-02 MED ORDER — PANTOPRAZOLE SODIUM 20 MG PO TBEC: 20.0000 mg | DELAYED_RELEASE_TABLET | Freq: Every day | ORAL | 0 refills | Status: DC

## 2024-01-02 NOTE — Assessment & Plan Note (Signed)
 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 Discussed increasing trazodone  and starting Wellbutrin for depressive symptoms. Wellbutrin may take up to a month and a half for full effect and may reduce trazodone  need. Potential Wellbutrin side effects include increased anxiety. - Increase trazodone  dosage up to 200 mg as needed, in 25 mg increments, to manage sleep. - Prescribe Wellbutrin (bupropion) 150 mg daily for depression. - Schedule a video visit in one month to assess progress and adjust treatment as needed.

## 2024-01-02 NOTE — Patient Instructions (Addendum)
-   Obtain fasting labs with orders provided (can have water or black coffee but otherwise no food or drink x 8 hours before labs) - Review information provided - Attend eye doctor annually, dentist every 6 months, work towards or maintain 30 minutes of moderate intensity physical activity at least 5 days per week, and consume a balanced diet - Return in 1 year for physical - Contact us  for any questions between now and then   VISIT SUMMARY:  You had a follow-up visit after your bilateral plantar fasciitis surgery. We discussed your ongoing recovery, sleep disturbances, gastroesophageal reflux symptoms, and other health maintenance needs.  YOUR PLAN:  POSTOPERATIVE RECOVERY - BILATERAL PLANTAR FASCIITIS SURGERY: You are experiencing some wobbliness when walking after your surgery. -Participate in physical therapy to aid in your recovery and prevent long-term wobbliness. -We provided contact information for local physical therapy options. -Contact your podiatrist if you need any additional documentation for physical therapy.  SLEEP DISTURBANCE: Your sleep disturbances are related to bereavement, and your current medication is less effective. -Increase your trazodone  dosage up to 200 mg as needed, in 25 mg increments, to help manage your sleep. -We prescribed Wellbutrin (bupropion) 150 mg daily for depression, which may also help with sleep over time. -Schedule a video visit in one month to assess your progress and adjust treatment as needed.  GASTROESOPHAGEAL REFLUX DISEASE (GERD): You have ongoing symptoms of GERD, including a sensation of vomiting when swallowing pills. -Take Protonix 20 mg daily on an empty stomach. -Continue using Tums as needed. -Follow up with your dentist to assess the fit of your dentures.  WEIGHT: You have obesity but have recently lost some weight. Wellbutrin may help with weight loss by reducing cravings. -Continue with your current weight loss efforts and monitor  any changes.  GYNECOLOGIC HEALTH MAINTENANCE: You are due for a Pap smear and prefer a referral to gynecology. -We placed a referral to gynecology for your Pap smear.

## 2024-01-02 NOTE — Progress Notes (Signed)
 Annual Physical Exam Visit  Patient Information:  Patient ID: Frances May, female DOB: 27-Dec-1995 Age: 28 y.o. MRN: [id number]   Subjective:   CC: Annual Physical Exam  HPI:  Frances May is here for their annual physical.  I reviewed the past medical history, family history, social history, surgical history, and allergies today and changes were made as necessary.  Please see the problem list section below for additional details.  Past Medical History: Past Medical History:  Diagnosis Date   Anxiety    GERD (gastroesophageal reflux disease)    Healthcare maintenance 08/10/2022   Hypothyroidism 2020   MDD (major depressive disorder) 09/12/2023   Past Surgical History: Past Surgical History:  Procedure Laterality Date   GANGLION CYST EXCISION Left 06/02/2016   Procedure: REMOVAL GANGLION OF WRIST;  Surgeon: Helayne Glenn, MD;  Location: [facility] ORS;  Service: Orthopedics;  Laterality: Left;   NO PAST SURGERIES     PLANTAR FASCIA RELEASE Bilateral 11/10/2023   WISDOM TOOTH EXTRACTION     Family History: Family History  Problem Relation Age of Onset   Obesity Mother    Alcohol abuse Father    Allergies: Allergies  Allergen Reactions   Other Other (See Comments)    Cats - make pt's nose itch   Health Maintenance: Health Maintenance  Topic Date Due   Hepatitis B Vaccines 19-59 Average Risk (3 of 3 - 3-dose series) 11/16/1993   HPV VACCINES (1 - 3-dose SCDM series) Never done   Cervical Cancer Screening (HPV/Pap Cotest)  Never done   Influenza Vaccine  07/03/2024 (Originally 11/04/2023)   COVID-19 Vaccine (1 - 2024-25 season) 01/17/2025 (Originally 12/05/2023)   DTaP/Tdap/Td (6 - Td or Tdap) 09/10/2033   Hepatitis C Screening  Completed   HIV Screening  Completed   Pneumococcal Vaccine  Aged Out   Meningococcal B Vaccine  Aged Out    HM Colonoscopy   This patient has no relevant Health Maintenance data.    Medications: Current Outpatient Medications on File  Prior to Visit  Medication Sig Dispense Refill   ibuprofen  (ADVIL ) 800 MG tablet Take 1 tablet (800 mg total) by mouth 3 (three) times daily. 60 tablet 1   gabapentin  (NEURONTIN ) 300 MG capsule Take 1 capsule (300 mg total) by mouth 3 (three) times daily as needed. (Patient not taking: Reported on 01/02/2024) 60 capsule 0   oxyCODONE -acetaminophen  (PERCOCET) 5-325 MG tablet Take 1 tablet by mouth every 8 (eight) hours as needed for severe pain (pain score 7-10). (Patient not taking: Reported on 01/02/2024) 28 tablet 0   No current facility-administered medications on file prior to visit.    Discussed the use of AI scribe software for clinical note transcription with the patient, who gave verbal consent to proceed.   Objective:   Vitals:   01/02/24 0759  BP: 116/72  Pulse: 86  SpO2: 97%   Vitals:   01/02/24 0759  Weight: 243 lb (110.2 kg)  Height: 4' 11 (1.499 m)   Body mass index is 49.08 kg/m.  General: Well Developed, well nourished, and in no acute distress.  Neuro: Alert and oriented x3, extra-ocular muscles intact, sensation grossly intact. Cranial nerves II through XII are grossly intact, motor, sensory, and coordinative functions are intact. HEENT: Normocephalic, atraumatic, neck supple, no masses, no lymphadenopathy, thyroid nonenlarged. Oropharynx, nasopharynx, external ear canals are unremarkable. Skin: Warm and dry, no rashes noted.  Cardiac: Regular rate and rhythm, no murmurs rubs or gallops. No peripheral edema. Pulses symmetric. Respiratory:  Clear to auscultation bilaterally. Speaking in full sentences.  Abdominal: Soft, nontender, nondistended, positive bowel sounds, no masses, no organomegaly. Musculoskeletal: Stable, and with full range of motion.   Impression and Recommendations:   The patient was counselled, risk factors were discussed, and anticipatory guidance given.  Problem List Items Addressed This Visit     GERD (gastroesophageal reflux disease)  (Chronic)   Gastroesophageal reflux symptoms - Ongoing sensation of vomiting when swallowing pills - No recent use of Tums; previously ineffective - Sensation of fullness in the throat when swallowing  Gastroesophageal reflux disease with vomiting and possible denture fit issues GERD with vomiting, possibly exacerbated by denture fit issues. Discussed potential silent reflux contribution. - Prescribe Protonix 20 mg daily on an empty stomach. - Advise to continue using Tums as needed. - Recommend follow-up with dentist for denture fit assessment.      Relevant Medications   pantoprazole (PROTONIX) 20 MG tablet   Healthcare maintenance - Primary   Annual examination completed, risk stratification labs ordered, anticipatory guidance provided.  We will follow labs once resulted.  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 Discussed increasing trazodone  and starting Wellbutrin for depressive symptoms. Wellbutrin may take up to a month and a half for full effect and may reduce trazodone  need. Potential Wellbutrin side effects include increased anxiety. - Increase trazodone  dosage up to 200 mg as needed, in 25 mg increments, to manage sleep. - Prescribe Wellbutrin (bupropion) 150 mg daily for depression. - Schedule a video visit in one month to assess progress and adjust treatment as needed.       Relevant Medications   traZODone  (DESYREL ) 50 MG tablet   buPROPion (WELLBUTRIN XL) 150 MG 24 hr tablet   Morbid (severe) obesity due to excess calories (HCC)   Obesity Obesity with recent weight loss. Discussed potential weight loss benefits of Wellbutrin due to its effects on dopamine and reduced cravings.      Plantar fasciitis, left   Postoperative recovery - bilateral plantar fasciitis surgery - Underwent bilateral plantar fasciitis surgery on August 7th, 2025 - Out of work for three months following surgery - Experiences some wobbliness when walking - Podiatrist advised recovery could take three to six months  Status post bilateral plantar fasciotomy for plantar fasciitis Recovery ongoing with some wobbliness. Physical therapy essential for optimal recovery and prevention of long-term wobbliness. - Encourage participation in physical therapy. - Provide contact information for local physical therapy options. - Advise to contact podiatrist for any additional documentation needed for physical therapy.      Relevant Medications   pantoprazole (PROTONIX) 20 MG tablet   Other Visit Diagnoses       Insomnia, unspecified type       Relevant Medications   traZODone  (DESYREL ) 50 MG tablet   Other Relevant Orders   TSH + free T4     Other specified hypothyroidism  Relevant Orders   TSH + free T4        Orders & Medications Medications:  Meds ordered this encounter  Medications   pantoprazole (PROTONIX) 20 MG tablet    Sig: Take 1 tablet (20 mg total) by mouth daily.    Dispense:  30 tablet    Refill:  0   traZODone  (DESYREL ) 50 MG tablet    Sig: Take 1-4 tablets (50-200 mg total) by mouth at bedtime as needed for sleep. Increase in half-tab (25 mg) increments. Find lowest dose that adequately controls symptoms.    Dispense:  90 tablet    Refill:  0   buPROPion (WELLBUTRIN XL) 150 MG 24 hr tablet    Sig: Take 1 tablet (150 mg total) by mouth daily.    Dispense:  30  tablet    Refill:  0   Orders Placed This Encounter  Procedures   CBC   Comprehensive metabolic panel with GFR   Hemoglobin A1c   Lipid panel   TSH + free T4   Ambulatory referral to Obstetrics / Gynecology     Return in about 4 weeks (around 01/30/2024) for MyChart Video Visit.    Selinda JINNY Ku, MD, [facility]   Primary Care Sports Medicine Primary Care and Sports Medicine at [facility]

## 2024-01-02 NOTE — Assessment & Plan Note (Signed)
 Postoperative recovery - bilateral plantar fasciitis surgery - Underwent bilateral plantar fasciitis surgery on August 7th, 2025 - Out of work for three months following surgery - Experiences some wobbliness when walking - Podiatrist advised recovery could take three to six months  Status post bilateral plantar fasciotomy for plantar fasciitis Recovery ongoing with some wobbliness. Physical therapy essential for optimal recovery and prevention of long-term wobbliness. - Encourage participation in physical therapy. - Provide contact information for local physical therapy options. - Advise to contact podiatrist for any additional documentation needed for physical therapy.

## 2024-01-02 NOTE — Assessment & Plan Note (Signed)
 Gastroesophageal reflux symptoms - Ongoing sensation of vomiting when swallowing pills - No recent use of Tums; previously ineffective - Sensation of fullness in the throat when swallowing  Gastroesophageal reflux disease with vomiting and possible denture fit issues GERD with vomiting, possibly exacerbated by denture fit issues. Discussed potential silent reflux contribution. - Prescribe Protonix 20 mg daily on an empty stomach. - Advise to continue using Tums as needed. - Recommend follow-up with dentist for denture fit assessment.

## 2024-01-02 NOTE — Assessment & Plan Note (Addendum)
 Annual examination completed, risk stratification labs ordered, anticipatory guidance provided.  We will follow labs once resulted.  Gynecologic health maintenance - Due for Pap exam - Does not currently have a gynecologist

## 2024-01-02 NOTE — Assessment & Plan Note (Signed)
 Obesity Obesity with recent weight loss. Discussed potential weight loss benefits of Wellbutrin due to its effects on dopamine and reduced cravings.

## 2024-01-03 ENCOUNTER — Ambulatory Visit: Payer: Self-pay | Admitting: Family Medicine

## 2024-01-03 LAB — COMPREHENSIVE METABOLIC PANEL WITH GFR
ALT: 29 IU/L (ref 0–32)
AST: 19 IU/L (ref 0–40)
Albumin: 4.2 g/dL (ref 4.0–5.0)
Alkaline Phosphatase: 62 IU/L (ref 41–116)
BUN/Creatinine Ratio: 22 (ref 9–23)
BUN: 17 mg/dL (ref 6–20)
Bilirubin Total: 0.3 mg/dL (ref 0.0–1.2)
CO2: 23 mmol/L (ref 20–29)
Calcium: 9.7 mg/dL (ref 8.7–10.2)
Chloride: 101 mmol/L (ref 96–106)
Creatinine, Ser: 0.76 mg/dL (ref 0.57–1.00)
Globulin, Total: 2.3 g/dL (ref 1.5–4.5)
Glucose: 100 mg/dL — ABNORMAL HIGH (ref 70–99)
Potassium: 4.3 mmol/L (ref 3.5–5.2)
Sodium: 139 mmol/L (ref 134–144)
Total Protein: 6.5 g/dL (ref 6.0–8.5)
eGFR: 108 mL/min/1.73 (ref >59)

## 2024-01-03 LAB — LIPID PANEL
Chol/HDL Ratio: 4 ratio (ref 0.0–4.4)
Cholesterol, Total: 182 mg/dL (ref 100–199)
HDL: 45 mg/dL (ref >39)
LDL Chol Calc (NIH): 112 mg/dL — ABNORMAL HIGH (ref 0–99)
Triglycerides: 139 mg/dL (ref 0–149)
VLDL Cholesterol Cal: 25 mg/dL (ref 5–40)

## 2024-01-03 LAB — CBC
Hematocrit: 40 % (ref 34.0–46.6)
Hemoglobin: 12.8 g/dL (ref 11.1–15.9)
MCH: 28.8 pg (ref 26.6–33.0)
MCHC: 32 g/dL (ref 31.5–35.7)
MCV: 90 fL (ref 79–97)
Platelets: 315 x10E3/uL (ref 150–450)
RBC: 4.44 x10E6/uL (ref 3.77–5.28)
RDW: 14.3 % (ref 11.7–15.4)
WBC: 5.8 x10E3/uL (ref 3.4–10.8)

## 2024-01-03 LAB — TSH+FREE T4
Free T4: 1.13 ng/dL (ref 0.82–1.77)
TSH: 5.27 u[IU]/mL — ABNORMAL HIGH (ref 0.450–4.500)

## 2024-01-03 LAB — HEMOGLOBIN A1C
Est. average glucose Bld gHb Est-mCnc: 114 mg/dL
Hgb A1c MFr Bld: 5.6 % (ref 4.8–5.6)

## 2024-01-30 ENCOUNTER — Telehealth (INDEPENDENT_AMBULATORY_CARE_PROVIDER_SITE_OTHER): Admitting: Family Medicine

## 2024-01-30 ENCOUNTER — Encounter: Payer: Self-pay | Admitting: Family Medicine

## 2024-01-30 ENCOUNTER — Telehealth: Payer: Self-pay | Admitting: Family Medicine

## 2024-01-30 DIAGNOSIS — G4700 Insomnia, unspecified: Secondary | ICD-10-CM | POA: Diagnosis not present

## 2024-01-30 DIAGNOSIS — K2100 Gastro-esophageal reflux disease with esophagitis, without bleeding: Secondary | ICD-10-CM

## 2024-01-30 DIAGNOSIS — F320 Major depressive disorder, single episode, mild: Secondary | ICD-10-CM

## 2024-01-30 MED ORDER — PANTOPRAZOLE SODIUM 20 MG PO TBEC: 20.0000 mg | DELAYED_RELEASE_TABLET | Freq: Every day | ORAL | 0 refills | Status: DC | PRN

## 2024-01-30 MED ORDER — BUPROPION HCL ER (XL) 150 MG PO TB24: 150.0000 mg | ORAL_TABLET | Freq: Every day | ORAL | 0 refills | Status: DC

## 2024-01-30 MED ORDER — TRAZODONE HCL 100 MG PO TABS: 100.0000 mg | ORAL_TABLET | Freq: Every evening | ORAL | 0 refills | Status: DC | PRN

## 2024-01-30 NOTE — Assessment & Plan Note (Signed)
 Gastroesophageal reflux disease without esophagitis GERD symptoms managed with Protonix 20 mg. Advised dietary and lifestyle modifications. - Continue Protonix 20 mg. - Send dietary and lifestyle modification information through MyChart. - Advise to contact if symptoms persist or worsen.

## 2024-01-30 NOTE — Assessment & Plan Note (Addendum)
 Functional status - Plans to return to work soon - Believes that returning to work will help improve her mood  Major depressive disorder Significant improvement in depressive symptoms with current bupropion 150 mg dose. Prefers to wait for dose adjustment until returning to work, which may possibly cause stress but is expected to be beneficial long-term. - Continue bupropion at current dose. - Schedule follow-up in three months. - Advise to contact if medication adjustment is needed before follow-up.

## 2024-01-30 NOTE — Progress Notes (Signed)
 Primary Care / Sports Medicine Virtual Visit  Patient Information:  Patient ID: Frances May, female DOB: April 28, 1995 Age: 28 y.o. MRN: [id number]   Frances May is a pleasant 28 y.o. female presenting with the following:  Chief Complaint  Patient presents with   Depression   Gastroesophageal Reflux    Review of Systems: No fevers, chills, night sweats, weight loss, chest pain, or shortness of breath.   Patient Active Problem List   Diagnosis Date Noted   MDD (major depressive disorder) 09/12/2023   Plantar fasciitis, left 07/05/2023   Healthcare maintenance 08/10/2022   Morbid (severe) obesity due to excess calories (HCC) 06/29/2022   GERD (gastroesophageal reflux disease) 06/29/2022   Subclinical hypothyroidism 08/18/2021   Ganglion of left wrist 05/25/2016   Past Medical History:  Diagnosis Date   Anxiety    GERD (gastroesophageal reflux disease)    Healthcare maintenance 08/10/2022   Hypothyroidism 2020   MDD (major depressive disorder) 09/12/2023   Outpatient Encounter Medications as of 01/30/2024  Medication Sig   buPROPion (WELLBUTRIN XL) 150 MG 24 hr tablet Take 1 tablet (150 mg total) by mouth daily.   ibuprofen  (ADVIL ) 800 MG tablet Take 1 tablet (800 mg total) by mouth 3 (three) times daily.   oxyCODONE -acetaminophen  (PERCOCET) 5-325 MG tablet Take 1 tablet by mouth every 8 (eight) hours as needed for severe pain (pain score 7-10). (Patient not taking: Reported on 01/02/2024)   pantoprazole (PROTONIX) 20 MG tablet Take 1 tablet (20 mg total) by mouth daily as needed.   traZODone  (DESYREL ) 100 MG tablet Take 1 tablet (100 mg total) by mouth at bedtime as needed for sleep.   [DISCONTINUED] buPROPion (WELLBUTRIN XL) 150 MG 24 hr tablet Take 1 tablet (150 mg total) by mouth daily.   [DISCONTINUED] gabapentin  (NEURONTIN ) 300 MG capsule Take 1 capsule (300 mg total) by mouth 3 (three) times daily as needed. (Patient not taking: Reported on 01/02/2024)   [DISCONTINUED]  pantoprazole (PROTONIX) 20 MG tablet Take 1 tablet (20 mg total) by mouth daily.   [DISCONTINUED] traZODone  (DESYREL ) 50 MG tablet Take 1-4 tablets (50-200 mg total) by mouth at bedtime as needed for sleep. Increase in half-tab (25 mg) increments. Find lowest dose that adequately controls symptoms.   No facility-administered encounter medications on file as of 01/30/2024.   Past Surgical History:  Procedure Laterality Date   GANGLION CYST EXCISION Left 06/02/2016   Procedure: REMOVAL GANGLION OF WRIST;  Surgeon: Helayne Glenn, MD;  Location: [facility] ORS;  Service: Orthopedics;  Laterality: Left;   NO PAST SURGERIES     PLANTAR FASCIA RELEASE Bilateral 11/10/2023   WISDOM TOOTH EXTRACTION      Discussed the use of AI scribe software for clinical note transcription with the patient, who gave verbal consent to proceed.   Virtual Visit via MyChart Video:   I connected with Frances May on 01/30/24 via MyChart Video and verified that I am speaking with the correct person using appropriate identifiers.   The limitations, risks, security and privacy concerns of performing an evaluation and management service by MyChart Video, including the higher likelihood of inaccurate diagnoses and treatments, and the availability of in person appointments were reviewed. The possible need of an additional face-to-face encounter for complete and high quality delivery of care was discussed. The patient was also made aware that there may be a patient responsible charge related to this service. The patient expressed understanding and wishes to proceed.  Provider location is in medical facility. Patient  location is at their home, different from provider location. People involved in care of the patient during this telehealth encounter were myself, my nurse/medical assistant, and my front office/scheduling team member.  Objective findings:   General: Speaking full sentences, no audible heavy breathing. Sounds alert  and appropriately interactive. Well-appearing. Face symmetric. Extraocular movements intact. Pupils equal and round. No nasal flaring or accessory muscle use visualized.  Independent interpretation of notes and tests performed by another provider:   None  Pertinent History, Exam, Impression, and Recommendations:   Problem List Items Addressed This Visit     GERD (gastroesophageal reflux disease) - Primary (Chronic)   Gastroesophageal reflux disease without esophagitis GERD symptoms managed with Protonix 20 mg. Advised dietary and lifestyle modifications. - Continue Protonix 20 mg. - Send dietary and lifestyle modification information through MyChart. - Advise to contact if symptoms persist or worsen.      Relevant Medications   pantoprazole (PROTONIX) 20 MG tablet   MDD (major depressive disorder)   Functional status - Plans to return to work soon - Believes that returning to work will help improve her mood  Major depressive disorder Significant improvement in depressive symptoms with current bupropion 150 mg dose. Prefers to wait for dose adjustment until returning to work, which may possibly cause stress but is expected to be beneficial long-term. - Continue bupropion at current dose. - Schedule follow-up in three months. - Advise to contact if medication adjustment is needed before follow-up.      Relevant Medications   buPROPion (WELLBUTRIN XL) 150 MG 24 hr tablet   traZODone  (DESYREL ) 100 MG tablet   Other Visit Diagnoses       Insomnia, unspecified type       Relevant Medications   traZODone  (DESYREL ) 100 MG tablet        Orders & Medications Medications:  Meds ordered this encounter  Medications   buPROPion (WELLBUTRIN XL) 150 MG 24 hr tablet    Sig: Take 1 tablet (150 mg total) by mouth daily.    Dispense:  90 tablet    Refill:  0   traZODone  (DESYREL ) 100 MG tablet    Sig: Take 1 tablet (100 mg total) by mouth at bedtime as needed for sleep.     Dispense:  90 tablet    Refill:  0   pantoprazole (PROTONIX) 20 MG tablet    Sig: Take 1 tablet (20 mg total) by mouth daily as needed.    Dispense:  90 tablet    Refill:  0   No orders of the defined types were placed in this encounter.    I discussed the above assessment and treatment plan with the patient. The patient was provided an opportunity to ask questions and all were answered. The patient agreed with the plan and demonstrated an understanding of the instructions.   The patient was advised to call back or seek an in-person evaluation if the symptoms worsen or if the condition fails to improve as anticipated.   I provided a total time of 34 minutes including both face-to-face and non-face-to-face time on 01/30/2024 inclusive of time utilized for medical chart review, information gathering, care coordination with staff, and documentation completion.    Selinda JINNY Ku, MD, [facility]   Primary Care Sports Medicine Primary Care and Sports Medicine at [facility]

## 2024-01-30 NOTE — Patient Instructions (Signed)
 VISIT SUMMARY:  Today, we reviewed your progress with mood symptoms. You have shown significant improvement. We also discussed your current management of GERD.  YOUR PLAN:  MAJOR DEPRESSIVE DISORDER: You have shown significant improvement in your depressive symptoms with your current medication. -Continue taking bupropion at the current dose. -We will schedule a follow-up appointment in three months. -Please contact us  if you feel a medication adjustment is needed before your next appointment.  GASTROESOPHAGEAL REFLUX DISEASE (GERD): Your GERD symptoms are being managed with Protonix. -Continue taking Protonix 20 mg. -We will send you information on dietary and lifestyle modifications through MyChart. -Please contact us  if your symptoms persist or worsen.

## 2024-01-31 ENCOUNTER — Ambulatory Visit (INDEPENDENT_AMBULATORY_CARE_PROVIDER_SITE_OTHER): Admitting: Podiatry

## 2024-01-31 ENCOUNTER — Encounter: Payer: Self-pay | Admitting: Podiatry

## 2024-01-31 VITALS — Ht 59.0 in | Wt 243.0 lb

## 2024-01-31 DIAGNOSIS — M722 Plantar fascial fibromatosis: Secondary | ICD-10-CM

## 2024-01-31 DIAGNOSIS — Z0279 Encounter for issue of other medical certificate: Secondary | ICD-10-CM

## 2024-01-31 NOTE — Progress Notes (Signed)
   Chief Complaint  Patient presents with   Plantar Fasciitis    Pt is here due to f/u on bilateral feet after having surgery to both on 11/10/23, she states everything is going well.    Subjective:  Patient presents today status post bilateral endoscopic plantar fasciotomy.  DOS: 11/10/2023.  Doing well.  No new complaints  Past Medical History:  Diagnosis Date   Anxiety    GERD (gastroesophageal reflux disease)    Healthcare maintenance 08/10/2022   Hypothyroidism 2020   MDD (major depressive disorder) 09/12/2023    Past Surgical History:  Procedure Laterality Date   GANGLION CYST EXCISION Left 06/02/2016   Procedure: REMOVAL GANGLION OF WRIST;  Surgeon: Helayne Glenn, MD;  Location: [facility] ORS;  Service: Orthopedics;  Laterality: Left;   NO PAST SURGERIES     PLANTAR FASCIA RELEASE Bilateral 11/10/2023   WISDOM TOOTH EXTRACTION      Allergies  Allergen Reactions   Other Other (See Comments)    Cats - make pt's nose itch    Objective/Physical Exam Neurovascular status intact.  Incisions nicely healed.  There continues to be some slight tenderness along the plantar fasciotomy site which is expected   Assessment: 1. s/p EPF bilateral. DOS: 11/10/2023   Plan of Care:  -Patient was evaluated.  - Continue physical therapy -Continue custom molded orthotics with good supportive tennis shoes -Unfortunately she continues to have some tenderness and pain with recovery.  We are going to extend her time away from work for an additional 6 weeks. -FMLA paperwork was dropped off today and we will get it to our FMLA specialist in our [place] office tomorrow to be completed -Return to clinic 6 weeks  *works at [employer] in [place].  Planning for 8 weeks out of work.   Thresa EMERSON Sar, DPM [facility]  Dr. Thresa EMERSON Sar, DPM    [address] [place], [place] [address]                Office [phone]  Fax [phone]

## 2024-01-31 NOTE — Telephone Encounter (Signed)
 completed

## 2024-02-02 ENCOUNTER — Telehealth: Payer: Self-pay | Admitting: Podiatry

## 2024-02-02 NOTE — Telephone Encounter (Signed)
 Faxed [employer] [phone]  forms/notes for her extended leave. RTW approx 04/02/24.

## 2024-02-06 NOTE — Progress Notes (Unsigned)
 ANNUAL EXAM Patient name: Frances May MRN [id number]  Date of birth: 1996/01/23 Chief Complaint:   No chief complaint on file.  History of Present Illness:   Frances May is a 28 y.o. No obstetric history on file. Caucasian female being seen today for a routine annual exam.  Current complaints: ***  No LMP recorded. (Menstrual status: Irregular Periods).       The pregnancy intention screening data noted above was reviewed. Potential methods of contraception were discussed. The patient elected to proceed with No data recorded.   No results found for: DIAGPAP, HPVHIGH, ADEQPAP    Last pap ***. Results were: {Pap findings:25134}. H/O abnormal pap: {yes/yes***/no:23866} Last mammogram: ***. Results were: {normal, abnormal, n/a:23837}. Family h/o breast cancer: {yes***/no:23838} Last colonoscopy: ***. Results were: {normal, abnormal, n/a:23837}. Family h/o colorectal cancer: {yes***/no:23838}     01/30/2024   10:45 AM 01/02/2024    8:09 AM 08/16/2023    3:19 PM 07/05/2023    2:06 PM 08/10/2022    1:28 PM  Depression screen PHQ 2/9  Decreased Interest 0 2 3 1  0  Down, Depressed, Hopeless 1 1 3 2  0  PHQ - 2 Score 1 3 6 3  0  Altered sleeping 0 3 3 3  0  Tired, decreased energy 2 3 3 2  0  Change in appetite 1 2 3 2  0  Feeling bad or failure about yourself  3 0 3 1 0  Trouble concentrating 0 2 3 1  0  Moving slowly or fidgety/restless 1 3 3 1  0  Suicidal thoughts 0 0 0 0 0  PHQ-9 Score 8 16 24 13  0  Difficult doing work/chores Somewhat difficult Somewhat difficult Somewhat difficult Somewhat difficult Not difficult at all        01/30/2024   10:49 AM 01/02/2024    8:09 AM 08/16/2023    3:20 PM 07/05/2023    2:06 PM  GAD 7 : Generalized Anxiety Score  Nervous, Anxious, on Edge 0 0 3 2  Control/stop worrying 1 1 3 1   Worry too much - different things 0 1 3 1   Trouble relaxing 0 0 3 0  Restless 0 2 3 0  Easily annoyed or irritable 0 0 3 2  Afraid - awful might happen  0 0 3 0  Total GAD 7 Score 1 4 21 6   Anxiety Difficulty Not difficult at all Somewhat difficult Somewhat difficult Not difficult at all      Past Medical History:  Diagnosis Date   Anxiety    GERD (gastroesophageal reflux disease)    Healthcare maintenance 08/10/2022   Hypothyroidism 2020   MDD (major depressive disorder) 09/12/2023    Family History  Problem Relation Age of Onset   Obesity Mother    Alcohol abuse Father    Review of Systems:   Pertinent items are noted in HPI Denies any headaches, blurred vision, fatigue, shortness of breath, chest pain, abdominal pain, abnormal vaginal discharge/itching/odor/irritation, problems with periods, bowel movements, urination, or intercourse unless otherwise stated above. Pertinent History Reviewed:  Reviewed past medical,surgical, social and family history.  Reviewed problem list, medications and allergies. Physical Assessment:  There were no vitals filed for this visit.There is no height or weight on file to calculate BMI.       Physical Exam   No results found for this or any previous visit (from the past 24 hours).  Assessment & Plan:  1. Well woman exam with routine gynecological exam (Primary)   Mammogram: {Mammo q/l:74787::@  30yo}, or sooner if problems Colonoscopy: {TCS f/u:25213::@ 30yo}, or sooner if problems  No orders of the defined types were placed in this encounter.   Meds: No orders of the defined types were placed in this encounter.   Follow-up: No follow-ups on file.  Rollo JINNY Maxin, CMA 02/06/2024 4:09 PM

## 2024-02-06 NOTE — Patient Instructions (Signed)

## 2024-02-07 ENCOUNTER — Encounter: Payer: Self-pay | Admitting: Certified Nurse Midwife

## 2024-02-07 ENCOUNTER — Ambulatory Visit: Admitting: Certified Nurse Midwife

## 2024-02-07 VITALS — BP 109/77 | HR 96 | Ht 59.0 in | Wt 237.5 lb

## 2024-02-07 DIAGNOSIS — Z1151 Encounter for screening for human papillomavirus (HPV): Secondary | ICD-10-CM

## 2024-02-07 DIAGNOSIS — Z124 Encounter for screening for malignant neoplasm of cervix: Secondary | ICD-10-CM

## 2024-02-07 DIAGNOSIS — Z01419 Encounter for gynecological examination (general) (routine) without abnormal findings: Secondary | ICD-10-CM

## 2024-02-09 ENCOUNTER — Other Ambulatory Visit (HOSPITAL_COMMUNITY)
Admission: RE | Admit: 2024-02-09 | Discharge: 2024-02-09 | Disposition: A | Discharge status: Home / Self Care | Source: Ambulatory Visit | Attending: Certified Nurse Midwife | Admitting: Certified Nurse Midwife

## 2024-02-09 DIAGNOSIS — Z124 Encounter for screening for malignant neoplasm of cervix: Secondary | ICD-10-CM | POA: Insufficient documentation

## 2024-02-09 DIAGNOSIS — Z1151 Encounter for screening for human papillomavirus (HPV): Secondary | ICD-10-CM | POA: Insufficient documentation

## 2024-02-09 NOTE — Addendum Note (Signed)
 Addended by: DELANA SAUER on: 02/09/2024 11:09 AM   Modules accepted: Orders

## 2024-02-13 ENCOUNTER — Ambulatory Visit: Payer: Self-pay | Admitting: Certified Nurse Midwife

## 2024-02-13 LAB — CYTOLOGY - PAP
Comment: NEGATIVE
Diagnosis: NEGATIVE
High risk HPV: NEGATIVE

## 2024-03-13 ENCOUNTER — Ambulatory Visit (INDEPENDENT_AMBULATORY_CARE_PROVIDER_SITE_OTHER): Admitting: Podiatry

## 2024-03-13 ENCOUNTER — Encounter: Payer: Self-pay | Admitting: Podiatry

## 2024-03-13 ENCOUNTER — Telehealth: Payer: Self-pay | Admitting: Podiatry

## 2024-03-13 VITALS — Ht 59.0 in | Wt 237.5 lb

## 2024-03-13 DIAGNOSIS — M722 Plantar fascial fibromatosis: Secondary | ICD-10-CM

## 2024-03-13 NOTE — Progress Notes (Signed)
   Chief Complaint  Patient presents with   Plantar Fasciitis    Pt is here to f/u on bilateral feet due to plantar fasciitis, she states the pain is better, states she is still doing physical therapy 1x a week and it is helping a lot.    Subjective:  Patient presents today status post bilateral endoscopic plantar fasciotomy.  DOS: 11/10/2023.  Doing well.  No new complaints  Past Medical History:  Diagnosis Date   Anxiety    GERD (gastroesophageal reflux disease)    Healthcare maintenance 08/10/2022   Hypothyroidism 2020   MDD (major depressive disorder) 09/12/2023    Past Surgical History:  Procedure Laterality Date   GANGLION CYST EXCISION Left 06/02/2016   Procedure: REMOVAL GANGLION OF WRIST;  Surgeon: Helayne Glenn, MD;  Location: [facility] ORS;  Service: Orthopedics;  Laterality: Left;   NO PAST SURGERIES     PLANTAR FASCIA RELEASE Bilateral 11/10/2023   WISDOM TOOTH EXTRACTION      Allergies  Allergen Reactions   Other Other (See Comments)    Cats - make pt's nose itch    Objective/Physical Exam Neurovascular status intact.  Incisions nicely healed.  No appreciable tenderness with palpation today.   Assessment: 1. s/p EPF bilateral. DOS: 11/10/2023   Plan of Care:  -Patient was evaluated.  - From a surgical standpoint the patient has no limitations or restrictions.  She does continue to have some tenderness with prolonged periods of standing and walking however. -Continue physical therapy sinceshe feels that she is getting good benefit from it -Expected return to work date 04/03/2024 -Return to clinic PRN    *works at [employer] in [employer].  Planning for 8 weeks out of work.   Thresa EMERSON Sar, DPM [facility]  Dr. Thresa EMERSON Sar, DPM    [address] [place], [place] [address]                Office [phone]  Fax [phone]

## 2024-03-13 NOTE — Telephone Encounter (Signed)
 per note from Dr. Janit call the pt to confirm RTW date. Her forms have 04/02/24 and she thought it was 04/03/24. I adv her could take till after holiday but she said she was good with 04/02/24 and I adv Dr. Janit I spoke with pt and 04/02/24 is RTW

## 2024-03-14 ENCOUNTER — Telehealth: Payer: Self-pay | Admitting: Podiatry

## 2024-03-14 NOTE — Telephone Encounter (Signed)
 Faxed [employer] fitness for duty cert form [phone] RTW 04/02/24 See prev notes

## 2024-03-15 ENCOUNTER — Telehealth: Payer: Self-pay | Admitting: Podiatry

## 2024-03-15 NOTE — Telephone Encounter (Signed)
 No charge for update of STD leave form to [id number]

## 2024-05-01 ENCOUNTER — Encounter: Payer: Self-pay | Admitting: Family Medicine

## 2024-05-01 ENCOUNTER — Telehealth: Admitting: Family Medicine

## 2024-05-01 DIAGNOSIS — G4700 Insomnia, unspecified: Secondary | ICD-10-CM | POA: Insufficient documentation

## 2024-05-01 DIAGNOSIS — F418 Other specified anxiety disorders: Secondary | ICD-10-CM | POA: Insufficient documentation

## 2024-05-01 DIAGNOSIS — M722 Plantar fascial fibromatosis: Secondary | ICD-10-CM | POA: Diagnosis not present

## 2024-05-01 DIAGNOSIS — F320 Major depressive disorder, single episode, mild: Secondary | ICD-10-CM

## 2024-05-01 DIAGNOSIS — K2100 Gastro-esophageal reflux disease with esophagitis, without bleeding: Secondary | ICD-10-CM

## 2024-05-01 MED ORDER — TRAZODONE HCL 100 MG PO TABS: 100.0000 mg | ORAL_TABLET | Freq: Every evening | ORAL | 0 refills | Status: AC | PRN

## 2024-05-01 MED ORDER — HYDROXYZINE PAMOATE 25 MG PO CAPS: 25.0000 mg | ORAL_CAPSULE | Freq: Three times a day (TID) | ORAL | 0 refills | Status: AC | PRN

## 2024-05-01 MED ORDER — BUPROPION HCL ER (XL) 300 MG PO TB24: 300.0000 mg | ORAL_TABLET | Freq: Every day | ORAL | 0 refills | Status: AC

## 2024-05-01 MED ORDER — PANTOPRAZOLE SODIUM 20 MG PO TBEC: 20.0000 mg | DELAYED_RELEASE_TABLET | Freq: Every day | ORAL | 0 refills | Status: AC | PRN

## 2024-05-01 NOTE — Progress Notes (Signed)
 "    Primary Care / Sports Medicine Virtual Visit  Patient Information:  Patient ID: Frances May, female DOB: April 16, 1995 Age: 29 y.o. MRN: [id number]   Frances May is a pleasant 30 y.o. female presenting with the following:  Chief Complaint  Patient presents with   Medical Management of Chronic Issues    Mood, sleep, and GERD.     Review of Systems: No fevers, chills, night sweats, weight loss, chest pain, or shortness of breath.   Patient Active Problem List   Diagnosis Date Noted   MDD (major depressive disorder) 09/12/2023   Plantar fasciitis, left 07/05/2023   Healthcare maintenance 08/10/2022   Morbid (severe) obesity due to excess calories (HCC) 06/29/2022   GERD (gastroesophageal reflux disease) 06/29/2022   Subclinical hypothyroidism 08/18/2021   Ganglion of left wrist 05/25/2016   Past Medical History:  Diagnosis Date   Anxiety    GERD (gastroesophageal reflux disease)    Healthcare maintenance 08/10/2022   Hypothyroidism 2020   MDD (major depressive disorder) 09/12/2023   Outpatient Encounter Medications as of 05/01/2024  Medication Sig   ibuprofen  (ADVIL ) 800 MG tablet Take 1 tablet (800 mg total) by mouth 3 (three) times daily.   [DISCONTINUED] buPROPion  (WELLBUTRIN  XL) 150 MG 24 hr tablet Take 1 tablet (150 mg total) by mouth daily.   [DISCONTINUED] pantoprazole  (PROTONIX ) 20 MG tablet Take 1 tablet (20 mg total) by mouth daily as needed.   [DISCONTINUED] traZODone  (DESYREL ) 100 MG tablet Take 1 tablet (100 mg total) by mouth at bedtime as needed for sleep.   buPROPion  (WELLBUTRIN  XL) 300 MG 24 hr tablet Take 1 tablet (300 mg total) by mouth daily.   hydrOXYzine  (VISTARIL ) 25 MG capsule Take 1-2 capsules (25-50 mg total) by mouth every 8 (eight) hours as needed for anxiety.   pantoprazole  (PROTONIX ) 20 MG tablet Take 1 tablet (20 mg total) by mouth daily as needed.   traZODone  (DESYREL ) 100 MG tablet Take 1-2 tablets (100-200 mg total) by mouth at bedtime as  needed for sleep.   No facility-administered encounter medications on file as of 05/01/2024.   Past Surgical History:  Procedure Laterality Date   GANGLION CYST EXCISION Left 06/02/2016   Procedure: REMOVAL GANGLION OF WRIST;  Surgeon: Helayne Glenn, MD;  Location: [facility] ORS;  Service: Orthopedics;  Laterality: Left;   NO PAST SURGERIES     PLANTAR FASCIA RELEASE Bilateral 11/10/2023   WISDOM TOOTH EXTRACTION      Discussed the use of AI scribe software for clinical note transcription with the patient, who gave verbal consent to proceed.   Virtual Visit via MyChart Video:   I connected with [name] on 05/01/24 via MyChart Video and verified that I am speaking with the correct person using appropriate identifiers.   The limitations, risks, security and privacy concerns of performing an evaluation and management service by MyChart Video, including the higher likelihood of inaccurate diagnoses and treatments, and the availability of in person appointments were reviewed. The possible need of an additional face-to-face encounter for complete and high quality delivery of care was discussed. The patient was also made aware that there may be a patient responsible charge related to this service. The patient expressed understanding and wishes to proceed.  Provider location is in medical facility. Patient location is at their home, different from provider location. People involved in care of the patient during this telehealth encounter were myself, my nurse/medical assistant, and my front office/scheduling team member.  Objective findings:   General:  Speaking full sentences, no audible heavy breathing. Sounds alert and appropriately interactive. Well-appearing. Face symmetric. Extraocular movements intact. Pupils equal and round. No nasal flaring or accessory muscle use visualized.  Independent interpretation of notes and tests performed by another provider:   None  Pertinent History,  Exam, Impression, and Recommendations:   Discussed the use of AI scribe software for clinical note transcription with the patient, who gave verbal consent to proceed.  History of Present Illness   Frances May is a 29 year old female with plantar fasciitis who presents for follow-up of persistent foot pain.  Plantar Foot Pain and Inflammatory Symptoms: - Persistent foot pain with associated swelling and erythema - Symptoms have worsened following a recent increase in physical activity, specifically walking up to seven hours per day - Pain is improved compared to prior episodes but remains persistent - Swelling and redness of the feet are present - Physical activity transition from low activity to prolonged walking has been physically challenging  Therapeutic Interventions and Response: - Intermittent performance of physical therapy exercises - Use of ibuprofen  as needed, with minimal relief - No use of topical analgesics such as IcyHot or Biofreeze     Assessment and Plan    Gastroesophageal reflux disease with esophagitis GERD symptoms controlled with low-dose Protonix  and lifestyle modifications. No breakthrough symptoms.  - Sent refill for Protonix  20 mg to pharmacy. - Advised to continue symptom-based dosing of Protonix  if asymptomatic. - Recommended ongoing attention to dietary triggers and meal timing.  Depression and anxiety Mood improved with PHQ-9 score of 8. Mild anxiety with concentration issues noted. Differential includes anxiety, depression, and ADHD. Bupropion  beneficial but may need dose adjustment. Psychiatric evaluation needed for concentration difficulties. - Sent prescription for bupropion  (Wellbutrin ) 300 mg daily to pharmacy. - Placed referral for formal psychiatric evaluation to assess for ADHD and other causes of concentration difficulties. - Scheduled follow-up in approximately two months to reassess mood, anxiety, and response to medication  adjustment.  Insomnia Intermittent insomnia persists, exacerbated by anxiety. Trazodone  used nightly. Hydroxyzine  indicated for as-needed use. - Sent prescription for hydroxyzine  (Vistaril ) 25 mg as needed for anxiety and sleep, with instructions to use 1-2 tablets as needed, especially on nights with anticipated poor sleep. - Sent refill for trazodone  to pharmacy. - Advised to use hydroxyzine  and trazodone  together on nights with increased anxiety and anticipated insomnia, starting with one new medication at a time before combining. - Provided guidance on titrating trazodone  dose up to 200 mg (2 tablets) as needed, but to avoid increasing dose on nights when starting hydroxyzine .  Plantar fasciitis Symptoms improving but foot pain, swelling, and erythema persist. Ibuprofen  minimally effective. Possible post-surgical nerve adjustment or complex regional pain syndrome. No acute surgical complication. - Recommended trial of over-the-counter topical agents such as IcyHot, Biofreeze, or lidocaine  for symptomatic relief. - Advised to continue physical therapy exercises at home. - Instructed to avoid topical agents during work hours until tolerability is established. - Advised to contact podiatry if symptoms do not improve with these measures. - Scheduled follow-up in approximately two months to reassess foot symptoms.        Problem List Items Addressed This Visit     GERD (gastroesophageal reflux disease) (Chronic)   Relevant Medications   pantoprazole  (PROTONIX ) 20 MG tablet   MDD (major depressive disorder)   Relevant Medications   buPROPion  (WELLBUTRIN  XL) 300 MG 24 hr tablet   traZODone  (DESYREL ) 100 MG tablet   hydrOXYzine  (VISTARIL ) 25 MG capsule  Other Visit Diagnoses       Depression with anxiety    -  Primary   Relevant Medications   buPROPion  (WELLBUTRIN  XL) 300 MG 24 hr tablet   traZODone  (DESYREL ) 100 MG tablet   hydrOXYzine  (VISTARIL ) 25 MG capsule     Insomnia,  unspecified type       Relevant Medications   traZODone  (DESYREL ) 100 MG tablet        Orders & Medications Medications:  Meds ordered this encounter  Medications   pantoprazole  (PROTONIX ) 20 MG tablet    Sig: Take 1 tablet (20 mg total) by mouth daily as needed.    Dispense:  90 tablet    Refill:  0   buPROPion  (WELLBUTRIN  XL) 300 MG 24 hr tablet    Sig: Take 1 tablet (300 mg total) by mouth daily.    Dispense:  90 tablet    Refill:  0   traZODone  (DESYREL ) 100 MG tablet    Sig: Take 1-2 tablets (100-200 mg total) by mouth at bedtime as needed for sleep.    Dispense:  90 tablet    Refill:  0   hydrOXYzine  (VISTARIL ) 25 MG capsule    Sig: Take 1-2 capsules (25-50 mg total) by mouth every 8 (eight) hours as needed for anxiety.    Dispense:  60 capsule    Refill:  0   No orders of the defined types were placed in this encounter.    I discussed the above assessment and treatment plan with the patient. The patient was provided an opportunity to ask questions and all were answered. The patient agreed with the plan and demonstrated an understanding of the instructions.   The patient was advised to call back or seek an in-person evaluation if the symptoms worsen or if the condition fails to improve as anticipated.   I provided a total time of 30 minutes including both face-to-face and non-face-to-face time on 05/01/2024 inclusive of time utilized for medical chart review, information gathering, care coordination with staff, and documentation completion.    Selinda JINNY Ku, MD, [facility]   Primary Care Sports Medicine Primary Care and Sports Medicine at [facility]   "

## 2024-05-01 NOTE — Patient Instructions (Signed)
" °  VISIT SUMMARY: During your visit, we discussed your ongoing foot pain due to plantar fasciitis, as well as your GERD, depression, anxiety, and insomnia. We adjusted your medications and provided additional recommendations to help manage your symptoms.  YOUR PLAN: PLANTAR FASCIITIS: You have persistent foot pain, swelling, and redness, which have worsened with increased physical activity. -Try over-the-counter topical agents like IcyHot, Biofreeze, or lidocaine  for relief. -Continue doing physical therapy exercises at home. -Avoid using topical agents during work hours until you know how they affect you. -Contact podiatry if your symptoms do not improve.  GASTROESOPHAGEAL REFLUX DISEASE (GERD): Your GERD symptoms are currently controlled with Protonix  and lifestyle changes. -Refilled your prescription for Protonix  20 mg. -Continue taking Protonix  based on your symptoms. -Keep paying attention to dietary triggers and meal timing.  DEPRESSION AND ANXIETY: Your mood has improved, but you still have mild anxiety and concentration issues. -Sent a prescription for bupropion  (Wellbutrin ) 300 mg daily. -Referred you for a formal psychiatric evaluation to assess for ADHD and other causes of concentration difficulties. -Follow up in approximately two months to reassess your mood, anxiety, and response to medication adjustment.  INSOMNIA: You have intermittent insomnia that is worsened by anxiety. -Sent a prescription for hydroxyzine  (Vistaril ) 25 mg to use as needed for anxiety and sleep. Take 1-2 tablets as needed, especially on nights when you expect poor sleep. -Refilled your prescription for trazodone . -Use hydroxyzine  and trazodone  together on nights with increased anxiety and anticipated insomnia, starting with one new medication at a time before combining. -You can increase your trazodone  dose up to 200 mg (2 tablets) as needed, but avoid increasing the dose on nights when you start  hydroxyzine .    Contains text generated by [employer].   "

## 2024-07-09 ENCOUNTER — Ambulatory Visit: Admitting: Family Medicine

## 2025-01-02 ENCOUNTER — Encounter: Admitting: Family Medicine
